# Patient Record
Sex: Male | Born: 1945 | Race: White | Hispanic: No | Marital: Married | Smoking: Current every day smoker
Health system: Southern US, Community
[De-identification: ages and names within clinical notes are randomized; demographics above are authoritative.]

## PROBLEM LIST (undated history)

## (undated) DIAGNOSIS — I10 Essential (primary) hypertension: Secondary | ICD-10-CM

## (undated) DIAGNOSIS — C61 Malignant neoplasm of prostate: Secondary | ICD-10-CM

## (undated) DIAGNOSIS — Z9289 Personal history of other medical treatment: Secondary | ICD-10-CM

## (undated) DIAGNOSIS — Z973 Presence of spectacles and contact lenses: Secondary | ICD-10-CM

## (undated) DIAGNOSIS — R51 Headache: Secondary | ICD-10-CM

## (undated) DIAGNOSIS — M8580 Other specified disorders of bone density and structure, unspecified site: Secondary | ICD-10-CM

## (undated) DIAGNOSIS — N529 Male erectile dysfunction, unspecified: Secondary | ICD-10-CM

## (undated) DIAGNOSIS — E785 Hyperlipidemia, unspecified: Secondary | ICD-10-CM

## (undated) DIAGNOSIS — R55 Syncope and collapse: Secondary | ICD-10-CM

## (undated) HISTORY — DX: Headache: R51

## (undated) HISTORY — DX: Other specified disorders of bone density and structure, unspecified site: M85.80

## (undated) HISTORY — DX: Syncope and collapse: R55

## (undated) HISTORY — PX: PROSTATE BIOPSY: SHX241

## (undated) HISTORY — DX: Hyperlipidemia, unspecified: E78.5

## (undated) HISTORY — PX: COLONOSCOPY: SHX174

## (undated) HISTORY — DX: Personal history of other medical treatment: Z92.89

## (undated) HISTORY — DX: Male erectile dysfunction, unspecified: N52.9

---

## 2000-10-31 ENCOUNTER — Ambulatory Visit (HOSPITAL_COMMUNITY): Admission: RE | Admit: 2000-10-31 | Discharge: 2000-10-31 | Payer: Self-pay | Admitting: General Surgery

## 2004-08-30 ENCOUNTER — Ambulatory Visit: Payer: Self-pay | Admitting: Internal Medicine

## 2004-10-03 ENCOUNTER — Ambulatory Visit: Payer: Self-pay | Admitting: Internal Medicine

## 2005-04-10 ENCOUNTER — Ambulatory Visit: Payer: Self-pay | Admitting: Internal Medicine

## 2005-04-18 ENCOUNTER — Ambulatory Visit: Payer: Self-pay | Admitting: Internal Medicine

## 2005-10-08 ENCOUNTER — Ambulatory Visit: Payer: Self-pay | Admitting: Internal Medicine

## 2005-10-17 ENCOUNTER — Encounter: Payer: Self-pay | Admitting: Internal Medicine

## 2005-10-18 ENCOUNTER — Ambulatory Visit: Payer: Self-pay | Admitting: Internal Medicine

## 2006-09-18 ENCOUNTER — Ambulatory Visit: Payer: Self-pay | Admitting: Internal Medicine

## 2006-09-18 LAB — CONVERTED CEMR LAB
BUN: 11 mg/dL (ref 6–23)
CO2: 31 meq/L (ref 19–32)
Calcium: 9.2 mg/dL (ref 8.4–10.5)
Chloride: 108 meq/L (ref 96–112)
Cholesterol: 218 mg/dL (ref 0–200)
Creatinine, Ser: 1 mg/dL (ref 0.4–1.5)
Direct LDL: 147.8 mg/dL
GFR calc Af Amer: 98 mL/min
GFR calc non Af Amer: 81 mL/min
Glucose, Bld: 82 mg/dL (ref 70–99)
HDL: 47.3 mg/dL (ref >39.0)
PSA: 1.93 ng/mL (ref 0.10–4.00)
Potassium: 4.3 meq/L (ref 3.5–5.1)
Sodium: 146 meq/L — ABNORMAL HIGH (ref 135–145)
Total CHOL/HDL Ratio: 4.6
Triglycerides: 118 mg/dL (ref 0–149)
VLDL: 24 mg/dL (ref 0–40)

## 2006-09-27 ENCOUNTER — Ambulatory Visit: Payer: Self-pay | Admitting: Internal Medicine

## 2007-04-07 ENCOUNTER — Ambulatory Visit: Payer: Self-pay | Admitting: Internal Medicine

## 2007-04-07 DIAGNOSIS — E785 Hyperlipidemia, unspecified: Secondary | ICD-10-CM | POA: Insufficient documentation

## 2007-04-09 LAB — CONVERTED CEMR LAB
ALT: 32 units/L (ref 0–53)
AST: 26 units/L (ref 0–37)
Cholesterol: 212 mg/dL (ref 0–200)
Direct LDL: 152.3 mg/dL
HDL: 38.5 mg/dL — ABNORMAL LOW (ref >39.0)
PSA: 1.18 ng/mL (ref 0.10–4.00)
TSH: 0.75 microintl units/mL (ref 0.35–5.50)
Testosterone: 669.3 ng/dL (ref 350.00–890)
Total CHOL/HDL Ratio: 5.5
Triglycerides: 91 mg/dL (ref 0–149)
VLDL: 18 mg/dL (ref 0–40)

## 2007-04-16 ENCOUNTER — Ambulatory Visit: Payer: Self-pay | Admitting: Internal Medicine

## 2007-04-16 DIAGNOSIS — F17200 Nicotine dependence, unspecified, uncomplicated: Secondary | ICD-10-CM | POA: Insufficient documentation

## 2007-04-16 DIAGNOSIS — F528 Other sexual dysfunction not due to a substance or known physiological condition: Secondary | ICD-10-CM | POA: Insufficient documentation

## 2007-04-22 ENCOUNTER — Encounter: Payer: Self-pay | Admitting: Internal Medicine

## 2007-06-06 ENCOUNTER — Ambulatory Visit: Payer: Self-pay | Admitting: Internal Medicine

## 2007-06-09 LAB — CONVERTED CEMR LAB
ALT: 30 units/L (ref 0–53)
AST: 27 units/L (ref 0–37)
Albumin: 3.8 g/dL (ref 3.5–5.2)
Alkaline Phosphatase: 53 units/L (ref 39–117)
Bilirubin, Direct: 0.2 mg/dL (ref 0.0–0.3)
Cholesterol: 134 mg/dL (ref 0–200)
HDL: 33.6 mg/dL — ABNORMAL LOW (ref >39.0)
LDL Cholesterol: 86 mg/dL (ref 0–99)
Total Bilirubin: 0.9 mg/dL (ref 0.3–1.2)
Total CHOL/HDL Ratio: 4
Total Protein: 6.5 g/dL (ref 6.0–8.3)
Triglycerides: 71 mg/dL (ref 0–149)
VLDL: 14 mg/dL (ref 0–40)

## 2007-07-04 ENCOUNTER — Ambulatory Visit: Payer: Self-pay | Admitting: Internal Medicine

## 2007-07-04 LAB — CONVERTED CEMR LAB
Cholesterol, target level: 200 mg/dL
HDL goal, serum: 40 mg/dL
LDL Goal: 130 mg/dL

## 2007-11-12 ENCOUNTER — Telehealth: Payer: Self-pay | Admitting: *Deleted

## 2007-11-12 ENCOUNTER — Ambulatory Visit: Payer: Self-pay | Admitting: Internal Medicine

## 2007-11-12 LAB — CONVERTED CEMR LAB: PSA: 1.61 ng/mL (ref 0.10–4.00)

## 2007-12-15 ENCOUNTER — Ambulatory Visit: Payer: Self-pay | Admitting: Internal Medicine

## 2007-12-15 DIAGNOSIS — M899 Disorder of bone, unspecified: Secondary | ICD-10-CM | POA: Insufficient documentation

## 2007-12-15 DIAGNOSIS — M949 Disorder of cartilage, unspecified: Secondary | ICD-10-CM

## 2008-06-18 ENCOUNTER — Ambulatory Visit: Payer: Self-pay | Admitting: Internal Medicine

## 2008-06-18 LAB — CONVERTED CEMR LAB
Cholesterol: 169 mg/dL (ref 0–200)
HDL: 44.6 mg/dL (ref >39.0)
LDL Cholesterol: 109 mg/dL — ABNORMAL HIGH (ref 0–99)
PSA: 1.72 ng/mL (ref 0.10–4.00)
Total CHOL/HDL Ratio: 3.8
Triglycerides: 78 mg/dL (ref 0–149)
VLDL: 16 mg/dL (ref 0–40)

## 2008-06-27 ENCOUNTER — Emergency Department (HOSPITAL_COMMUNITY): Admission: EM | Admit: 2008-06-27 | Discharge: 2008-06-27 | Payer: Self-pay | Admitting: Emergency Medicine

## 2008-06-28 ENCOUNTER — Ambulatory Visit: Payer: Self-pay | Admitting: Internal Medicine

## 2008-07-05 DIAGNOSIS — Z9289 Personal history of other medical treatment: Secondary | ICD-10-CM

## 2008-07-05 HISTORY — DX: Personal history of other medical treatment: Z92.89

## 2008-07-08 ENCOUNTER — Ambulatory Visit: Payer: Self-pay

## 2008-07-08 ENCOUNTER — Encounter: Payer: Self-pay | Admitting: Internal Medicine

## 2008-07-21 ENCOUNTER — Encounter: Payer: Self-pay | Admitting: Cardiology

## 2008-07-21 ENCOUNTER — Ambulatory Visit: Payer: Self-pay | Admitting: Cardiology

## 2008-10-21 ENCOUNTER — Telehealth: Payer: Self-pay | Admitting: *Deleted

## 2008-10-27 ENCOUNTER — Telehealth: Payer: Self-pay | Admitting: Internal Medicine

## 2008-12-13 ENCOUNTER — Ambulatory Visit: Payer: Self-pay | Admitting: Internal Medicine

## 2008-12-13 LAB — CONVERTED CEMR LAB
ALT: 27 units/L (ref 0–53)
AST: 28 units/L (ref 0–37)
Albumin: 3.5 g/dL (ref 3.5–5.2)
Alkaline Phosphatase: 55 units/L (ref 39–117)
Bilirubin, Direct: 0.1 mg/dL (ref 0.0–0.3)
Cholesterol: 137 mg/dL (ref 0–200)
HDL: 45.5 mg/dL (ref >39.00)
LDL Cholesterol: 80 mg/dL (ref 0–99)
PSA: 1.39 ng/mL (ref 0.10–4.00)
Total Bilirubin: 0.9 mg/dL (ref 0.3–1.2)
Total CHOL/HDL Ratio: 3
Total Protein: 6.4 g/dL (ref 6.0–8.3)
Triglycerides: 57 mg/dL (ref 0.0–149.0)
VLDL: 11.4 mg/dL (ref 0.0–40.0)

## 2008-12-23 ENCOUNTER — Ambulatory Visit: Payer: Self-pay | Admitting: Internal Medicine

## 2009-06-23 ENCOUNTER — Ambulatory Visit: Payer: Self-pay | Admitting: Internal Medicine

## 2009-07-12 LAB — CONVERTED CEMR LAB: PSA: 2.41 ng/mL (ref 0.10–4.00)

## 2009-10-04 ENCOUNTER — Ambulatory Visit: Payer: Self-pay | Admitting: Internal Medicine

## 2009-10-04 ENCOUNTER — Telehealth (INDEPENDENT_AMBULATORY_CARE_PROVIDER_SITE_OTHER): Payer: Self-pay | Admitting: *Deleted

## 2009-10-04 ENCOUNTER — Telehealth: Payer: Self-pay | Admitting: *Deleted

## 2009-10-07 ENCOUNTER — Encounter: Payer: Self-pay | Admitting: *Deleted

## 2009-10-07 LAB — CONVERTED CEMR LAB: PSA: 2.07 ng/mL (ref 0.10–4.00)

## 2010-04-18 ENCOUNTER — Ambulatory Visit: Payer: Self-pay | Admitting: Internal Medicine

## 2010-04-18 LAB — CONVERTED CEMR LAB
ALT: 23 units/L (ref 0–53)
AST: 24 units/L (ref 0–37)
Albumin: 3.8 g/dL (ref 3.5–5.2)
Alkaline Phosphatase: 61 units/L (ref 39–117)
BUN: 13 mg/dL (ref 6–23)
Basophils Absolute: 0.1 10*3/uL (ref 0.0–0.1)
Basophils Relative: 0.8 % (ref 0.0–3.0)
Bilirubin, Direct: 0.1 mg/dL (ref 0.0–0.3)
CO2: 29 meq/L (ref 19–32)
Calcium: 9.1 mg/dL (ref 8.4–10.5)
Chloride: 104 meq/L (ref 96–112)
Cholesterol: 168 mg/dL (ref 0–200)
Creatinine, Ser: 1 mg/dL (ref 0.4–1.5)
Eosinophils Absolute: 0.2 10*3/uL (ref 0.0–0.7)
Eosinophils Relative: 2.7 % (ref 0.0–5.0)
GFR calc non Af Amer: 79.82 mL/min (ref >60)
Glucose, Bld: 87 mg/dL (ref 70–99)
HCT: 44.5 % (ref 39.0–52.0)
HDL: 49.4 mg/dL (ref >39.00)
Hemoglobin: 15 g/dL (ref 13.0–17.0)
LDL Cholesterol: 106 mg/dL — ABNORMAL HIGH (ref 0–99)
Lymphocytes Relative: 32.4 % (ref 12.0–46.0)
Lymphs Abs: 2.6 10*3/uL (ref 0.7–4.0)
MCHC: 33.6 g/dL (ref 30.0–36.0)
MCV: 95.5 fL (ref 78.0–100.0)
Monocytes Absolute: 0.7 10*3/uL (ref 0.1–1.0)
Monocytes Relative: 8.4 % (ref 3.0–12.0)
Neutro Abs: 4.4 10*3/uL (ref 1.4–7.7)
Neutrophils Relative %: 55.7 % (ref 43.0–77.0)
PSA: 0.61 ng/mL (ref 0.10–4.00)
Platelets: 331 10*3/uL (ref 150.0–400.0)
Potassium: 4.3 meq/L (ref 3.5–5.1)
RBC: 4.66 M/uL (ref 4.22–5.81)
RDW: 13.9 % (ref 11.5–14.6)
Sodium: 139 meq/L (ref 135–145)
TSH: 0.73 microintl units/mL (ref 0.35–5.50)
Total Bilirubin: 0.7 mg/dL (ref 0.3–1.2)
Total CHOL/HDL Ratio: 3
Total Protein: 6.1 g/dL (ref 6.0–8.3)
Triglycerides: 62 mg/dL (ref 0.0–149.0)
VLDL: 12.4 mg/dL (ref 0.0–40.0)
WBC: 7.9 10*3/uL (ref 4.5–10.5)

## 2010-04-26 ENCOUNTER — Ambulatory Visit: Payer: Self-pay | Admitting: Internal Medicine

## 2010-07-04 NOTE — Assessment & Plan Note (Signed)
Summary: rov/mm reschedule/mhf      Allergies Added: NKDA  Vital Signs:  Patient Profile:   65 Years Old Male Weight:      144 pounds Temp:     98.3 degrees F oral BP sitting:   112 / 80  (left arm)  Vitals Entered By: Raechel Ache, RN (July 04, 2007 2:06 PM)                 Chief Complaint:  Talk about meds..  History of Present Illness: Brian Rodriguez   comes in today for follow up of  lipids. No side effect of meds.  see last ov. Recent job stresses.   Still smoking.   Try to stop and exercise.  Less junk food.    Following PSA  q 6 months , No symptom s now .      Lipid Management History:      Positive NCEP/ATP III risk factors include male age 70 years old or older, HDL cholesterol less than 40, and current tobacco user.  Negative NCEP/ATP III risk factors include non-diabetic, non-hypertensive, no ASHD (atherosclerotic heart disease), and no peripheral vascular disease.        He expresses no side effects from his lipid-lowering medication.  The patient denies any symptoms to suggest myopathy or liver disease from his "statin" therapy.     Current Allergies: No known allergies   Past Medical History:    see chart    Hyperlipidemia   Family History:    Reviewed history from 04/16/2007 and no changes required:       no premature heart disease        Family History of Prostate CA 1st degree relative <50  Social History:    Current Smoker    Married    Building control surveyor, [employer]    Regular exercise-no   Risk Factors:  Exercise:  no   Review of Systems  The patient denies anorexia, chest pain, dyspnea on exhertion, and prolonged cough.     Physical Exam  General:     alert, well-developed, well-nourished, and well-hydrated.   Additional Exam:     see labs and results  ldl    below 100   hdl low  ratio 4      Impression & Recommendations:  Problem # 1:  HYPERLIPIDEMIA (ICD-272.4) better  add lifestyle intervention  d/c tobacco His updated  medication list for this problem includes:    Mevacor 20 Mg Tabs (Lovastatin) .Marland Kitchen... 1 by mouth once daily  for cholesterol  Labs Reviewed: Chol: 134 (06/06/2007)   HDL: 33.6 (06/06/2007)   LDL: 86 (06/06/2007)   TG: 71 (06/06/2007) SGOT: 27 (06/06/2007)   SGPT: 30 (06/06/2007)  Lipid Goals: Chol Goal: 200 (07/04/2007)   HDL Goal: 40 (07/04/2007)   LDL Goal: 130 (07/04/2007)   TG Goal: 150 (07/04/2007)  10 Yr Risk Heart Disease: 11 %   Problem # 2:  TOBACCO USE (ICD-305.1) Encouraged smoking cessation and discussed different methods for smoking cessation.   Problem # 3:  NEOPLASM, MALIGNANT, PROSTATE, FAMILY HX (ICD-V16.42)  follow  and reapeat psa in may or april  Problem # 4:  ERECTILE DYSFUNCTION (ICD-302.72) Assessment: Comment Only d/c tobacco His updated medication list for this problem includes:    Viagra 50 Mg Tabs (Sildenafil citrate) .Marland Kitchen... 1 by mouth as needed   Complete Medication List: 1)  Viagra 50 Mg Tabs (Sildenafil citrate) .Marland Kitchen.. 1 by mouth as needed 2)  Multivitamins Caps (Multiple vitamin) 3)  Mevacor 20 Mg Tabs (Lovastatin) .Marland Kitchen.. 1 by mouth once daily  for cholesterol  Lipid Assessment/Plan:      Based on NCEP/ATP III, the patient's risk factor category is "2 or more risk factors and a calculated 10 year CAD risk of < 20%".  From this information, the patient's calculated lipid goals are as follows: Total cholesterol goal is 200; LDL cholesterol goal is 130; HDL cholesterol goal is 40; Triglyceride goal is 150.     Patient Instructions: 1)  PSA  in  May or June  Rov with me in July.    ]

## 2010-07-04 NOTE — Letter (Signed)
Summary: Generic Letter  [employer] [facility]  [address] [place], [place] [address]   Phone: [phone]  Fax: [phone]    10/07/2009  [facility] [address] [place], [place]  [address]  Dear Mr. MEISSNER,  Your PSA results  slightly lower than last time. We will recheck at this at your yearly visit  CPX  ( in 6 months with full set of labs. Please call our office to schedule this appt.          Sincerely,   Tor Netters, CMA (AAMA)

## 2010-07-04 NOTE — Assessment & Plan Note (Signed)
Summary: cpx/njr   Vital Signs:  Patient profile:   65 year old male Height:      64 inches Weight:      143 pounds BMI:     24.63 Pulse rate:   72 / minute BP sitting:   110 / 70  (left arm) Cuff size:   regular  Vitals Entered By: Romualdo Bolk, CMA (AAMA) (April 26, 2010 10:03 AM) CC: CPX   History of Present Illness: Brian Rodriguez comes in today  for preventive visit and med follow up  Since last visit  here  there have been no major changes in health status   Still smoking 1ppd ED uses viagra  1/2 pill sometimes  LIPIDS: no se of meds   Preventive Care Screening  Colonoscopy:    Date:  06/05/2003    Results:  normal   Prior Values:    PSA:  0.61 (04/18/2010)   Preventive Screening-Counseling & Management  Alcohol-Tobacco     Alcohol drinks/day: <1     Alcohol type: wine     Smoking Status: current     Smoking Cessation Counseling: yes     Smoke Cessation Stage: contemplative     Packs/Day: 1.0     Tobacco Counseling: to quit use of tobacco products  Caffeine-Diet-Exercise     Caffeine use/day: 1-2     Does Patient Exercise: no  Hep-HIV-STD-Contraception     Dental Visit-last 6 months no     Dental Care Counseling: to seek dental care; no dental care within six months     Sun Exposure-Excessive: no  Safety-Violence-Falls     Seat Belt Use: yes     Firearms in the Home: firearms in the home     Firearm Counseling: not indicated; uses recommended firearm safety measures     Smoke Detectors: yes     Fall Risk: none   Current Problems (verified): 1)  Osteopenia  (ICD-733.90) 2)  ? of Colonic Polyps, Hx of  (ICD-V12.72) 3)  Neoplasm, Malignant, Prostate, Family Hx  (ICD-V16.42) 4)  Tobacco Use  (ICD-305.1) 5)  Erectile Dysfunction  (ICD-302.72) 6)  Hyperlipidemia  (ICD-272.4)  Current Medications (verified): 1)  Viagra 50 Mg  Tabs (Sildenafil Citrate) .Marland Kitchen.. 1 By Mouth As Needed 2)  Multivitamins   Caps (Multiple Vitamin) 3)  Mevacor 40 Mg  Tabs (Lovastatin) .Marland Kitchen.. 1 By Mouth Once Daily  Allergies (verified): No Known Drug Allergies  Past History:  Past medical, surgical, family and social histories (including risk factors) reviewed, and no changes noted (except as noted below).  Past Medical History: 1. Hyperlipidemia 2. Headache 3. ED 4. Syncope 1/10 5. Echocardiogram (2/10):  EF 55-60%, no regional wall motion abnormalities, mildly calcified aortic valve.  ? osteopenia  Tobacco user tdap 2007 Colonic polyps, hx of?  Past Surgical History: Denies surgical history  Past History:  Care Management: Cardiology: Shirlee Latch  Family History: Reviewed history from 12/23/2008 and no changes required. No premature heart disease  Family History of Prostate CA 1st degree relative <50 father prostate cancer Brother prostate cancer    recently PSa went up      Social History: Reviewed history from 07/21/2008 and no changes required. Current Smoker: 1/2 ppd Married, 4 children  hhof  3  1 dog  Regular exercise-no Works in [employer]   works 2-3 shift  Sleep   6.5 hours sleep   Seat Belt Use:  yes Dental Care w/in 6 mos.:  no Sun Exposure-Excessive:  no Fall Risk:  none   Review of Systems       sometimes knees hurt  to squat.  no sig nocturia .    no cp sob and denies pulmonary signs .  rest of ros neg or no change see hpi  Physical Exam  General:  Well-developed,well-nourished,in no acute distress; alert,appropriate and cooperative throughout examination Head:  normocephalic, atraumatic, and no abnormalities observed.   Eyes:  vision grossly intact, pupils equal, and pupils round.  glasses  Ears:  R ear normal, L ear normal, and no external deformities.   Nose:  no external deformity, no external erythema, and no nasal discharge.   Mouth:  good dentition and pharynx pink and moist.   Neck:  No deformities, masses, or tenderness noted. Lungs:  Normal respiratory effort, chest expands symmetrically. Lungs are  clear to auscultation, no crackles or wheezes.no dullness.   Heart:  Normal rate and regular rhythm. S1 and S2 normal without gallop, murmur, click, rub or other extra sounds.no lifts.   Abdomen:  Bowel sounds positive,abdomen soft and non-tender without masses, organomegaly or hernias noted. Rectal:  declined Prostate:  declined  Msk:  no joint tenderness, no joint swelling, no joint warmth, no redness over joints, and no joint deformities.   Pulses:  pulses intact without delay  no bruits  Extremities:  No clubbing, cyanosis, edema, or deformity noted with normal full range of motion of all joints.   Neurologic:  alert & oriented X3, cranial nerves II-XII intact, strength normal in all extremities, and gait normal.   no abnormal reflexes Skin:  turgor normal, color normal, no ecchymoses, and no petechiae.  sunchanges  Cervical Nodes:  No lymphadenopathy noted Axillary Nodes:  No palpable lymphadenopathy Inguinal Nodes:  No significant adenopathy Psych:  Oriented X3, normally interactive, good eye contact, not anxious appearing, and not depressed appearing.     Impression & Recommendations:  Problem # 1:  PREVENTIVE HEALTH CARE (ICD-V70.0) Discussed nutrition,exercise,diet,healthy weight, vitamin D and calcium.     Reviewed preventive care protocols, scheduled due services, and updated immunizations.  Problem # 2:  TOBACCO USE (ICD-305.1) not ready to stop.  still counseled  Encouraged smoking cessation and discussed different methods for smoking cessation.   Orders: Tobacco use cessation intermediate 3-10 minutes ([address]109)  Problem # 3:  ERECTILE DYSFUNCTION (ICD-302.72) no change  dc tobacco  His updated medication list for this problem includes:    Viagra 50 Mg Tabs (Sildenafil citrate) .Marland Kitchen... 1 by mouth as needed  Problem # 4:  HYPERLIPIDEMIA (ICD-272.4) no change  His updated medication list for this problem includes:    Mevacor 40 Mg Tabs (Lovastatin) .Marland Kitchen... 1 by mouth once  daily  Labs Reviewed: SGOT: 24 (04/18/2010)   SGPT: 23 (04/18/2010)  Lipid Goals: Chol Goal: 200 (07/04/2007)   HDL Goal: 40 (07/04/2007)   LDL Goal: 130 (07/04/2007)   TG Goal: 150 (07/04/2007)  Prior 10 Yr Risk Heart Disease: 11 % (07/04/2007)   HDL:49.40 (04/18/2010), 45.50 (12/13/2008)  LDL:106 (04/18/2010), 80 (60/45/4098)  Chol:168 (04/18/2010), 137 (12/13/2008)  Trig:62.0 (04/18/2010), 57.0 (12/13/2008)  Complete Medication List: 1)  Viagra 50 Mg Tabs (Sildenafil citrate) .Marland Kitchen.. 1 by mouth as needed 2)  Multivitamins Caps (Multiple vitamin) 3)  Mevacor 40 Mg Tabs (Lovastatin) .Marland Kitchen.. 1 by mouth once daily  Other Orders: Tdap => 37yrs IM (11914) Admin 1st Vaccine (78295)   Patient Instructions: 1)  stop smoking   2)  continue medication. 3)  cpx with labs in a year  4)  have pharmacy contact us  when refills needed .  Prescriptions: MEVACOR 40 MG TABS (LOVASTATIN) 1 by mouth once daily  #30 Tablet x 12   Entered and Authorized by:   Madelin Headings MD   Signed by:   Madelin Headings MD on 04/26/2010   Method used:   Elect[facility]ically to        [id number]       [address]       [place], [place]  [address]       Ph: [phone] or [phone]       Fax: [phone]   RxID:   [id number]    Orders Added: 1)  Tdap => 46yrs IM [90715] 2)  Admin 1st Vaccine [90471] 3)  Est. Patient 40-64 years [99396] 4)  Tobacco use cessation intermediate 3-10 minutes [99406]   Immunizations Administered:  Tetanus Vaccine:    Vaccine Type: Tdap    Site: right deltoid    Mfr: GlaxoSmithKline    Dose: 0.5 ml    Route: IM    Given by: Romualdo Bolk, CMA (AAMA)    Exp. Date: 03/23/2012    Lot #: [id number]   Immunizations Administered:  Tetanus Vaccine:    Vaccine Type: Tdap    Site: right deltoid    Mfr: GlaxoSmithKline    Dose: 0.5 ml    Route: IM    Given by: Romualdo Bolk, CMA (AAMA)    Exp. Date: 03/23/2012    Lot #:  [id number]   Preventive Care Screening  Colonoscopy:    Date:  06/05/2003    Results:  normal   Prior Values:    PSA:  0.61 (04/18/2010)

## 2010-07-04 NOTE — Assessment & Plan Note (Signed)
Summary: FDOLLOW UP FROM BLOOD WORK/MHF   Vital Signs:  Patient Profile:   65 Years Old Male Weight:      142 pounds Pulse rate:   78 / minute BP sitting:   110 / 70  (right arm) Cuff size:   regular  Vitals Entered By: Romualdo Bolk, CMA (June 28, 2008 2:55 PM)                 Chief Complaint:  Follow up.  History of Present Illness: Brian Rodriguez is here for a follow up on psa and cholesterol.   Interim hx : Was seen in ER  [facility]  1/24  for syncope.  .  ? if wash food poisoning  ?  Had fish sandwich and ff   and lots of burping .   no sig alcohol  ,  dehydration.  ?     no water all day  but coffee all day.   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  went by EMS to hospital  .     had neg ekg  troponins and  cxray and cbc and bmet.     went home ama after  no sob or palpitations .He apparently had pusle of 40  in ambulance with signs but then ekg neg and eval was neg   .    Feels fine today.  No recent viagra/ . No ha visin change numbness or weakness.  No vomiting or diarrhea . No hx of fainting ever in his life. He feels he is fine but comes in for follow up today.  as instructed.  LIPIds: no se of meds       needs refill. TObacco : has cut down to 1/2 ppd .  trying to eat right.     Prior Medications Reviewed Using: Patient Recall  Prior Medication List:  VIAGRA 50 MG  TABS (SILDENAFIL CITRATE) 1 by mouth as needed MULTIVITAMINS   CAPS (MULTIPLE VITAMIN)  MEVACOR 20 MG  TABS (LOVASTATIN) 1 by mouth once daily  for cholesterol   Current Allergies (reviewed today): No known allergies   Past Medical History:    see chart    Hyperlipidemia    Headache    ED    tdap 2007    Colonic polyps, hx of?        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     Physical Exam  General:     Well-developed,well-nourished,in no acute distress; alert,appropriate and cooperative throughout examination Head:     normocephalic and atraumatic.   Eyes:     vision grossly intact, pupils equal, and pupils round.   Ears:     no external deformities.   Nose:     no external deformity.   Neck:  No deformities, masses, or tenderness noted. Lungs:     Normal respiratory effort, chest expands symmetrically. Lungs are clear to auscultation, no crackles or wheezes. Heart:     Normal rate and regular rhythm. S1 and S2 normal without gallop, murmur, click, rub or other extra sounds.no gallop.   Abdomen:     Bowel sounds positive,abdomen soft and non-tender without masses, organomegaly or  noted. Pulses:     intact without delay Extremities:     no cce  Neurologic:     non focal gait normal and DTRs symmetrical and normal.   Skin:     turgor normal, color normal, no ecchymoses, and no petechiae.   Cervical Nodes:     No lymphadenopathy noted Psych:     Oriented X3, memory intact for recent and remote, normally interactive, good eye contact, not anxious appearing, and not depressed appearing.   Additional Exam:     EKG NSR no acute changes  rate  73 and normal intervals  Records in E chart  obtained and reviewed  and labs .    Impression & Recommendations:  Problem # 1:  SYNCOPE (ICD-780.2) Assessment: New sounds vagal but recurrent  and with pulse of 40 still could have been vagal.   during that night.    has risk factors   .... will  do echo  and cards consult about any need for further eval.  Orders: Cardiology Referral (Cardiology) Echo Referral (Echo) EKG w/ Interpretation (93000)   Problem # 2:  HYPERLIPIDEMIA (ICD-272.4) Assessment: Improved no se of meds  His updated medication list for this problem includes:    Mevacor 20 Mg Tabs (Lovastatin) .Marland Kitchen... 1 by mouth once daily  for cholesterol  Labs Reviewed: Chol: 169 (06/18/2008)   HDL: 44.6 (06/18/2008)   LDL: 109 (06/18/2008)   TG: 78 (06/18/2008) SGOT: 27 (06/06/2007)   SGPT: 30 (06/06/2007)  Lipid Goals: Chol Goal: 200 (07/04/2007)   HDL Goal: 40 (07/04/2007)   LDL Goal: 130 (07/04/2007)   TG Goal: 150 (07/04/2007)  Prior 10 Yr Risk Heart Disease: 11 % (07/04/2007)   Problem # 3:  TOBACCO USE (ICD-305.1) rec dc  Problem # 4:  NEOPLASM, MALIGNANT, PROSTATE, FAMILY HX (ICD-V16.42) Assessment: Comment Only follow   Problem # 5:  ERECTILE DYSFUNCTION (ICD-302.72)  His updated medication list for this problem includes:    Viagra 50 Mg Tabs (Sildenafil citrate) .Marland Kitchen... 1 by mouth as needed   Complete Medication List: 1)  Viagra 50 Mg Tabs (Sildenafil citrate) .Marland Kitchen.. 1 by mouth as needed 2)  Multivitamins Caps (Multiple vitamin) 3)  Mevacor 20 Mg Tabs (Lovastatin) .Marland Kitchen.. 1 by mouth once daily  for cholesterol   Patient Instructions: 1)  continue on statin medication.  2)   continue tobacco cessation. 3)  will call you about referral to  echo test and cardiology for evaluation of syncope. 4)  rec return office visit after evaluation from   cardiology.   Prescriptions: MEVACOR 20 MG  TABS (LOVASTATIN) 1 by mouth once daily  for cholesterol  #30 Tablet x 6   Entered and Authorized by:   Madelin Headings MD   Signed by:   Madelin Headings MD on 06/28/2008   Method used:   [facility]        [facility] [phone][facility] (retail)       [address]       [place], [place]  [address]       Ph: [phone] or [phone]  Fax: [phone]   RxID:    [id number]

## 2010-07-04 NOTE — Progress Notes (Signed)
Summary: REQUEST FOR REFILLS  Phone Note Refill Request Message from:  Patient on Oct 04, 2009 11:31 AM  Refills Requested: Medication #1:  VIAGRA 50 MG  TABS 1 by mouth as needed  Medication #2:  MEVACOR 40 MG TABS 1 by mouth once daily.  Method Requested: Electronic Next Appointment Scheduled: NONE Initial call taken by: Roney Jaffe,  Oct 04, 2009 11:31 AM  Follow-up for Phone Call        please refill x 6  Follow-up by: Madelin Headings MD,  Oct 04, 2009 12:25 PM  Additional Follow-up for Phone Call Additional follow up Details #1::        Rx sent to pharmacy Additional Follow-up by: Romualdo Bolk, CMA Duncan Dull),  Oct 04, 2009 1:40 PM    Prescriptions: MEVACOR 40 MG TABS (LOVASTATIN) 1 by mouth once daily  #30 Tablet x 5   Entered by:   Romualdo Bolk, CMA (AAMA)   Authorized by:   Madelin Headings MD   Signed by:   Romualdo Bolk, CMA (AAMA) on 10/04/2009   Method used:   Electronically to        [facility] [phone][facility] (retail)       [address]       [place], [place]  [address]       Ph: [phone] or [phone]       Fax: [phone]   RxID:   [id number] VIAGRA 50 MG  TABS (SILDENAFIL CITRATE) 1 by mouth as needed  #6 x 5   Entered by:   Romualdo Bolk, CMA (AAMA)   Authorized by:   Madelin Headings MD   Signed by:   Romualdo Bolk, CMA (AAMA) on 10/04/2009   Method used:   Electronically to        [facility] [phone]* (retail)       [address]       [place], [place]  [address]       Ph: [phone] or [phone]       Fax: [phone]   RxID:   [id number]

## 2010-07-04 NOTE — Progress Notes (Signed)
Summary: TO Brian Rodriguez  Phone Note Call from Patient Call back at [facility] Phone [phone]   Caller: Patient- LIVE CALL Summary of Call: RETURNING Fernanda Twaddell'S CALL ABOUT A RX. Initial call taken by: Warnell Forester,  Oct 21, 2008 9:07 AM  Follow-up for Phone Call        Left message on machine to call back. Follow-up by: Romualdo Bolk, CMA,  Oct 21, 2008 12:10 PM  Additional Follow-up for Phone Call Additional follow up Details #1::        Pt aware of results. Additional Follow-up by: Romualdo Bolk, CMA,  Oct 25, 2008 9:36 AM      Appended Document: TO [name] Medications Added MEVACOR 40 MG TABS (LOVASTATIN) 1 by mouth once daily       Pt needs [place] to go ahead and call in the lovastatin 40mg . Rx called in. Romualdo Bolk, CMA  Oct 25, 2008 9:39 AM   Clinical Lists Changes  Medications: Changed medication from MEVACOR 20 MG  TABS (LOVASTATIN) 1 by mouth once daily  for cholesterol to MEVACOR 40 MG TABS (LOVASTATIN) 1 by mouth once daily - Signed Rx of MEVACOR 40 MG TABS (LOVASTATIN) 1 by mouth once daily;  #30 x 5;  Signed;  Entered by: Romualdo Bolk, CMA;  Authorized by: Madelin Headings MD;  Method used: [id number] [address], [place], [place]  [address], Ph: [phone] or [phone], Fax: [phone]    Prescriptions: MEVACOR 40 MG TABS (LOVASTATIN) 1 by mouth once daily  #30 x 5   Entered by:   Romualdo Bolk, CMA   Authorized by:   Madelin Headings MD   Signed by:   Romualdo Bolk, CMA on 10/25/2008   Method used:   Electronically to        [facility] [phone]* (retail)       [address]       [place], [place]  [address]       Ph: [phone] or [phone]       Fax: [phone]   RxID:   [id number]

## 2010-09-18 LAB — POCT I-STAT, CHEM 8
BUN: 17 mg/dL (ref 6–23)
Calcium, Ion: 1.07 mmol/L — ABNORMAL LOW (ref 1.12–1.32)
Chloride: 106 mEq/L (ref 96–112)
Creatinine, Ser: 1.2 mg/dL (ref 0.4–1.5)
Glucose, Bld: 126 mg/dL — ABNORMAL HIGH (ref 70–99)
HCT: 44 % (ref 39.0–52.0)
Hemoglobin: 15 g/dL (ref 13.0–17.0)
Potassium: 3.6 mEq/L (ref 3.5–5.1)
Sodium: 140 mEq/L (ref 135–145)
TCO2: 23 mmol/L (ref 0–100)

## 2010-09-18 LAB — DIFFERENTIAL
Basophils Absolute: 0 10*3/uL (ref 0.0–0.1)
Basophils Relative: 0 % (ref 0–1)
Eosinophils Absolute: 0.2 10*3/uL (ref 0.0–0.7)
Eosinophils Relative: 2 % (ref 0–5)
Lymphocytes Relative: 28 % (ref 12–46)
Lymphs Abs: 2.8 10*3/uL (ref 0.7–4.0)
Monocytes Absolute: 0.7 10*3/uL (ref 0.1–1.0)
Monocytes Relative: 7 % (ref 3–12)
Neutro Abs: 6.3 10*3/uL (ref 1.7–7.7)
Neutrophils Relative %: 63 % (ref 43–77)

## 2010-09-18 LAB — POCT CARDIAC MARKERS
CKMB, poc: <1 ng/mL — ABNORMAL LOW (ref 1.0–8.0)
Myoglobin, poc: 53.5 ng/mL (ref 12–200)
Troponin i, poc: <0.05 ng/mL (ref 0.00–0.09)

## 2010-09-18 LAB — CBC
HCT: 42.7 % (ref 39.0–52.0)
Hemoglobin: 14 g/dL (ref 13.0–17.0)
MCHC: 32.7 g/dL (ref 30.0–36.0)
MCV: 93 fL (ref 78.0–100.0)
Platelets: 338 10*3/uL (ref 150–400)
RBC: 4.59 MIL/uL (ref 4.22–5.81)
RDW: 13.9 % (ref 11.5–15.5)
WBC: 10.1 10*3/uL (ref 4.0–10.5)

## 2010-10-17 NOTE — Assessment & Plan Note (Signed)
[facility]                                 ON-CALL NOTE   Brian Rodriguez, Brian Rodriguez                          MRN:          [id number]  DATE:06/27/2008                            DOB:          07-14-1945    TIME RECEIVED:  2:12 a.m.   He sees Dr. Fabian Sharp.   CALLER:  Dr. Renella Cunas in [facility] Emergency Room.   I was connected to him can via the [employer], the answering  service.  The patient was seen in the emergency room after 2 syncopal  spells at home, each time he had dizziness and flushing and passed out  briefly.  While he was en route to the emergency room with EMS, his  pulse slowed down into the 40s.  After a brief workup in the emergency  room, nothing specific was found to explain this.  It was recommended  that he be kept overnight for observation, but the patient refused.  He  is now leaving against medical advice.  Apparently, he is scheduled to  see Dr. Fabian Sharp on Monday.  Dr. Katrinka Blazing simply wanted me to relay this  information.     Tera Mater. Clent Ridges, MD  Electronically Signed    SAF/MedQ  DD: 06/27/2008  DT: 06/27/2008  Job #: [id number]

## 2010-10-26 ENCOUNTER — Telehealth: Payer: Self-pay | Admitting: Internal Medicine

## 2010-10-26 NOTE — Telephone Encounter (Signed)
Pt called and is req to get psa and cholesterol lvls checked. Pls advise.

## 2010-10-31 NOTE — Telephone Encounter (Signed)
Lft vm for pt to cb to sch requested labs, as noted. Waiting on call back.

## 2010-10-31 NOTE — Telephone Encounter (Signed)
Per Dr. Fabian Sharp- okay to scheduled lipids and psa dx 272.4 and screening for prostate cancer.

## 2010-11-07 NOTE — Telephone Encounter (Signed)
Lft another vm for pt to cb to sch the labs requested, as noted.

## 2010-11-23 NOTE — Telephone Encounter (Signed)
Pt called back and lft vm. Tried calling pt back, but got vm again. Lft vm for pt and told pt that info re: sch labs is in computer in pts chart, so anyone can sch when pt calls back again.

## 2010-12-01 ENCOUNTER — Other Ambulatory Visit (INDEPENDENT_AMBULATORY_CARE_PROVIDER_SITE_OTHER): Payer: BC Managed Care – PPO

## 2010-12-01 DIAGNOSIS — E785 Hyperlipidemia, unspecified: Secondary | ICD-10-CM

## 2010-12-01 DIAGNOSIS — Z125 Encounter for screening for malignant neoplasm of prostate: Secondary | ICD-10-CM

## 2010-12-01 LAB — LIPID PANEL
Cholesterol: 141 mg/dL (ref 0–200)
HDL: 45.9 mg/dL (ref >39.00)
LDL Cholesterol: 76 mg/dL (ref 0–99)
Total CHOL/HDL Ratio: 3
Triglycerides: 98 mg/dL (ref 0.0–149.0)
VLDL: 19.6 mg/dL (ref 0.0–40.0)

## 2010-12-01 LAB — PSA: PSA: 1.63 ng/mL (ref 0.10–4.00)

## 2010-12-05 ENCOUNTER — Encounter: Payer: Self-pay | Admitting: *Deleted

## 2010-12-15 ENCOUNTER — Other Ambulatory Visit: Payer: Self-pay | Admitting: Internal Medicine

## 2010-12-15 NOTE — Telephone Encounter (Signed)
LCPX 04/26/10 NOV NONE PLEASE ADVISE

## 2011-05-23 ENCOUNTER — Other Ambulatory Visit: Payer: Self-pay | Admitting: Internal Medicine

## 2011-05-23 DIAGNOSIS — E785 Hyperlipidemia, unspecified: Secondary | ICD-10-CM

## 2011-05-23 DIAGNOSIS — Z125 Encounter for screening for malignant neoplasm of prostate: Secondary | ICD-10-CM

## 2011-05-23 NOTE — Telephone Encounter (Signed)
Pt said he needed to come in for his 6 month follow up and said he needed lab work first. There is no order in system what labs does pt need?

## 2011-06-15 ENCOUNTER — Other Ambulatory Visit (INDEPENDENT_AMBULATORY_CARE_PROVIDER_SITE_OTHER): Payer: BC Managed Care – PPO

## 2011-06-15 DIAGNOSIS — Z125 Encounter for screening for malignant neoplasm of prostate: Secondary | ICD-10-CM

## 2011-06-15 DIAGNOSIS — E785 Hyperlipidemia, unspecified: Secondary | ICD-10-CM

## 2011-06-15 LAB — LIPID PANEL
Cholesterol: 159 mg/dL (ref 0–200)
HDL: 44.5 mg/dL (ref >39.00)
LDL Cholesterol: 92 mg/dL (ref 0–99)
Total CHOL/HDL Ratio: 4
Triglycerides: 115 mg/dL (ref 0.0–149.0)
VLDL: 23 mg/dL (ref 0.0–40.0)

## 2011-06-15 LAB — PSA: PSA: 1.59 ng/mL (ref 0.10–4.00)

## 2011-06-26 ENCOUNTER — Encounter: Payer: Self-pay | Admitting: *Deleted

## 2011-06-26 NOTE — Progress Notes (Signed)
Quick Note:    Letter sent to pt.  ______

## 2011-07-10 ENCOUNTER — Telehealth: Payer: Self-pay | Admitting: *Deleted

## 2011-07-10 NOTE — Telephone Encounter (Signed)
Refills on lovastatin 40mg  and viagra 50mg  for a 90 days supply

## 2011-07-12 MED ORDER — LOVASTATIN 40 MG PO TABS: 40.0000 mg | ORAL_TABLET | Freq: Every day | ORAL | Status: DC

## 2011-07-12 MED ORDER — SILDENAFIL CITRATE 50 MG PO TABS: 50.0000 mg | ORAL_TABLET | ORAL | Status: DC | PRN

## 2011-07-12 NOTE — Telephone Encounter (Signed)
Labs noted  Ok to do 90 days supply of each  No refill. Still needs  Yearly office visit  Labs stable

## 2012-03-26 ENCOUNTER — Other Ambulatory Visit (INDEPENDENT_AMBULATORY_CARE_PROVIDER_SITE_OTHER): Payer: BC Managed Care – PPO

## 2012-03-26 DIAGNOSIS — Z0000 Encounter for general adult medical examination without abnormal findings: Secondary | ICD-10-CM

## 2012-03-26 LAB — POCT URINALYSIS DIPSTICK
Bilirubin, UA: NEGATIVE
Glucose, UA: NEGATIVE
Ketones, UA: NEGATIVE
Leukocytes, UA: NEGATIVE
Nitrite, UA: NEGATIVE
Protein, UA: NEGATIVE
Spec Grav, UA: 1.02
Urobilinogen, UA: 0.2
pH, UA: 7

## 2012-03-26 LAB — CBC WITH DIFFERENTIAL/PLATELET
Basophils Absolute: 0.1 10*3/uL (ref 0.0–0.1)
Basophils Relative: 0.7 % (ref 0.0–3.0)
Eosinophils Absolute: 0.2 10*3/uL (ref 0.0–0.7)
Eosinophils Relative: 2.4 % (ref 0.0–5.0)
HCT: 46.1 % (ref 39.0–52.0)
Hemoglobin: 15 g/dL (ref 13.0–17.0)
Lymphocytes Relative: 25.5 % (ref 12.0–46.0)
Lymphs Abs: 2.2 10*3/uL (ref 0.7–4.0)
MCHC: 32.6 g/dL (ref 30.0–36.0)
MCV: 95.8 fl (ref 78.0–100.0)
Monocytes Absolute: 0.7 10*3/uL (ref 0.1–1.0)
Monocytes Relative: 8.5 % (ref 3.0–12.0)
Neutro Abs: 5.4 10*3/uL (ref 1.4–7.7)
Neutrophils Relative %: 62.9 % (ref 43.0–77.0)
Platelets: 338 10*3/uL (ref 150.0–400.0)
RBC: 4.81 Mil/uL (ref 4.22–5.81)
RDW: 14.2 % (ref 11.5–14.6)
WBC: 8.6 10*3/uL (ref 4.5–10.5)

## 2012-03-26 LAB — HEPATIC FUNCTION PANEL
ALT: 27 U/L (ref 0–53)
AST: 26 U/L (ref 0–37)
Albumin: 3.5 g/dL (ref 3.5–5.2)
Alkaline Phosphatase: 54 U/L (ref 39–117)
Bilirubin, Direct: 0.2 mg/dL (ref 0.0–0.3)
Total Bilirubin: 1 mg/dL (ref 0.3–1.2)
Total Protein: 6.6 g/dL (ref 6.0–8.3)

## 2012-03-26 LAB — BASIC METABOLIC PANEL
BUN: 13 mg/dL (ref 6–23)
CO2: 30 mEq/L (ref 19–32)
Calcium: 9.2 mg/dL (ref 8.4–10.5)
Chloride: 107 mEq/L (ref 96–112)
Creatinine, Ser: 0.9 mg/dL (ref 0.4–1.5)
GFR: 85.22 mL/min (ref >60.00)
Glucose, Bld: 77 mg/dL (ref 70–99)
Potassium: 5.3 mEq/L — ABNORMAL HIGH (ref 3.5–5.1)
Sodium: 143 mEq/L (ref 135–145)

## 2012-03-26 LAB — LIPID PANEL
Cholesterol: 157 mg/dL (ref 0–200)
HDL: 47.5 mg/dL (ref >39.00)
LDL Cholesterol: 93 mg/dL (ref 0–99)
Total CHOL/HDL Ratio: 3
Triglycerides: 83 mg/dL (ref 0.0–149.0)
VLDL: 16.6 mg/dL (ref 0.0–40.0)

## 2012-03-28 LAB — TSH: TSH: 1.22 u[IU]/mL (ref 0.35–5.50)

## 2012-03-28 LAB — PSA: PSA: 1.89 ng/mL (ref 0.10–4.00)

## 2012-04-09 ENCOUNTER — Encounter: Payer: Self-pay | Admitting: Internal Medicine

## 2012-04-09 ENCOUNTER — Ambulatory Visit (INDEPENDENT_AMBULATORY_CARE_PROVIDER_SITE_OTHER): Payer: BC Managed Care – PPO | Admitting: Internal Medicine

## 2012-04-09 VITALS — BP 146/90 | HR 75 | Temp 98.4°F | Ht 63.75 in | Wt 144.0 lb

## 2012-04-09 DIAGNOSIS — F17200 Nicotine dependence, unspecified, uncomplicated: Secondary | ICD-10-CM

## 2012-04-09 DIAGNOSIS — F528 Other sexual dysfunction not due to a substance or known physiological condition: Secondary | ICD-10-CM

## 2012-04-09 DIAGNOSIS — R030 Elevated blood-pressure reading, without diagnosis of hypertension: Secondary | ICD-10-CM

## 2012-04-09 DIAGNOSIS — Z8042 Family history of malignant neoplasm of prostate: Secondary | ICD-10-CM

## 2012-04-09 DIAGNOSIS — D485 Neoplasm of uncertain behavior of skin: Secondary | ICD-10-CM | POA: Insufficient documentation

## 2012-04-09 DIAGNOSIS — E785 Hyperlipidemia, unspecified: Secondary | ICD-10-CM

## 2012-04-09 DIAGNOSIS — Z1211 Encounter for screening for malignant neoplasm of colon: Secondary | ICD-10-CM

## 2012-04-09 DIAGNOSIS — Z0000 Encounter for general adult medical examination without abnormal findings: Secondary | ICD-10-CM

## 2012-04-09 MED ORDER — SILDENAFIL CITRATE 50 MG PO TABS: 50.0000 mg | ORAL_TABLET | ORAL | Status: DC | PRN

## 2012-04-09 NOTE — Progress Notes (Signed)
Chief Complaint  Patient presents with  . Annual Exam  . Hyperlipidemia    HPI: Patient comes in today for Preventive Health Care visit  Last visit here was  04/2010 Since that time he has had no major changes in his health status. He continues to work 36-48 hours a week. His mother did pass away from Parkinson's in her 51s. He continues to use tobacco about one pack per day states it is not a good time to stop. No chest pain shortness of breath new urinary symptoms. He gets wax buildup in his right ear. Household of 3 PET dog social alcohol occasional some caffeine. Uses viagra 1/2 dose gets heartburn described as chest bubble better with burping and no sob or cardiac sx.   ROS:  GEN/ HEENT: No fever, significant weight changes sweats headaches vision problems hearing changes, CV/ PULM; No chest pain shortness of breath cough, syncope,edema  change in exercise tolerance. GI /GU: No adominal pain, vomiting, change in bowel habits. No blood in the stool. No significant GU symptoms. SKIN/HEME: ,no acute skin rashes urea of right neck has been there a couple years uncertain if changing or bleeding. No lymphadenopathy, nodules, masses.  NEURO/ PSYCH:  No neurologic signs such as weakness numbness. No depression anxiety. IMM/ Allergy: No unusual infections.  Allergy .   REST of 12 system review negative except as per HPI   Past Medical History  Diagnosis Date  . Hyperlipidemia   . Headache   . ED (erectile dysfunction)   . Syncope   . Osteopenia   . H/O echocardiogram 2/10    ef 50 -60 mildly calcific aortic valve    Family History  Problem Relation Age of Onset  . Cancer Father     Prostate  . Cancer Brother     Prostate  . Parkinsonism Mother     deceased 82s     History   Social History  . Marital Status: Married    Spouse Name: April Hinchliffe    Number of Children: 4  . Years of Education: N/A   Social History Main Topics  . Smoking status: Current Every Day Smoker    . Smokeless tobacco: None  . Alcohol Use: Yes  . Drug Use: None  . Sexually Active: None   Other Topics Concern  . 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 lovastatin (MEVACOR) 40 MG tablet Take 1 tablet (40 mg total) by mouth at bedtime.  90 tablet  3  . sildenafil (VIAGRA) 50 MG tablet Take 1 tablet (50 mg total) by mouth as needed for erectile dysfunction.  18 tablet  3  . [DISCONTINUED] sildenafil (VIAGRA) 50 MG tablet Take 1 tablet (50 mg total) by mouth as needed for erectile dysfunction.  12 tablet  3    EXAM: BP 146/90  Pulse 75  Temp 98.4 F (36.9 C) (Oral)  Ht 5' 3.75" (1.619 m)  Wt 144 lb (65.318 kg)  BMI 24.91 kg/m2  SpO2 97%  Physical Exam: Vital signs reviewed [id number] is a well-developed well-nourished alert cooperative   male who appears her stated age in no acute distress.  HEENT: normocephalic atraumatic , Eyes: PERRL EOM's full, conjunctiva clear, Nares: paten,t no deformity discharge or tenderness., Ears: no deformity EAC's wax in right TMs with normal landmarks. Mouth: clear OP,  no lesions, edema.  Moist mucous membranes. Dentition in adequate repair. NECK: supple without masses, thyromegaly or bruits. CHEST/PULM:  Clear to auscultation and percussion breath sounds equal no wheeze , rales or rhonchi. No chest wall deformities or tenderness. CV: PMI is nondisplaced, S1 S2 no gallops, murmurs, rubs. Peripheral pulses are without delay.No JVD . No bruits  ABDOMEN: Bowel sounds normal nontender  No guard or rebound, no hepato splenomegal no CVA tenderness.  No hernia. Extremtities:  No clubbing cyanosis or edema, no acute joint swelling or redness no focal atrophy NEURO:  Oriented x3, cranial nerves 3-12 appear to be intact, no obvious focal weakness,gait within normal limits no abnormal reflexes or  asymmetrical SKIN: No acute rashes normal turgor, color, no bruising or petechiae. Solar changes: right neck dark keratotic lesion with pigment base about 1 cm  also dark round mole 3 mm left back   Toe nails dystrophic thickened   PSYCH: Oriented, good eye contact, no obvious depression anxiety, cognition and judgment appear normal. LN: no cervical axillary inguinal adenopathy RECtal no masses : prostate smooth 1-2 +  Lab Results  Component Value Date   WBC 8.6 03/26/2012   HGB 15.0 03/26/2012   HCT 46.1 03/26/2012   PLT 338.0 03/26/2012   GLUCOSE 77 03/26/2012   CHOL 157 03/26/2012   TRIG 83.0 03/26/2012   HDL 47.50 03/26/2012   LDLDIRECT 152.3 04/07/2007   LDLCALC 93 03/26/2012   ALT 27 03/26/2012   AST 26 03/26/2012   NA 143 03/26/2012   K 5.3* 03/26/2012   CL 107 03/26/2012   CREATININE 0.9 03/26/2012   BUN 13 03/26/2012   CO2 30 03/26/2012   TSH 1.22 03/26/2012   PSA 1.89 03/26/2012    ASSESSMENT AND PLAN: Discussed the following assessment and plan: Preventive Health Care Counseled regarding healthy nutrition, exercise, sleep, injury prevention, calcium vit d and healthy weight . Flu vaccine obtained at work Disc  Shingles vaccine check on insurance coverage  1. Visit for preventive health examination     refer for colon  2. Family hx of prostate cancer    3. Elevated blood pressure reading    4. HYPERLIPIDEMIA    5. ERECTILE DYSFUNCTION     ok to use 1/2 dose  6. TOBACCO USE    7. Neoplasm of uncertain behavior of skin  Ambulatory referral to Dermatology   right neck /left back  refer to derm  8. Screening for colon cancer  Ambulatory referral to Gastroenterology  potassium elevation prob from phlebotomy effect .  LIPID no se of med continue FAM HX prostate cancer  monitor  Tobacco Counseled. To stop pt not ready disc chantix Ed basically using 25 mg equivalent ok to refill 90 days  For a year  Contact us for refill. Elevated Bp reading    Disc getting  readings 3 x per week and if not at goal then plan fu visit . Reviewed record nl lv func echo years ago calcium at AV no as no m heard today will follow  ? If had a sig colonic polyps and may be due for colonoscopy the next year  .  Refer for colonoscopy. Skin lesions  Uncertain behavior  could be a large SK but not typical  With scaly base options discussed  Derm referral about this lesion and another  Dark mole on back .   Patient Instructions  Will do referral for colonoscopy   As we discussed . Advise stop tobacco   Contact  us if  We can help.  Will do a referral  For dermatology consult. Continue same meds  Look in to getting shingle vaccine Labs and CPX in a year  Preventive Care for Adults, Male A healthy lifestyle and preventive care can promote health and wellness. Preventive health guidelines for men include the following key practices:  A routine yearly physical is a good way to check with your caregiver about your health and preventative screening. It is a chance to share any concerns and updates on your health, and to receive a thorough exam.  Visit your dentist for a routine exam and preventative care every 6 months. Brush your teeth twice a day and floss once a day. Good oral hygiene prevents tooth decay and gum disease.  The frequency of eye exams is based on your age, health, family medical history, use of contact lenses, and other factors. Follow your caregiver's recommendations for frequency of eye exams.  Eat a healthy diet. Foods like vegetables, fruits, whole grains, low-fat dairy products, and lean protein foods contain the nutrients you need without too many calories. Decrease your intake of foods high in solid fats, added sugars, and salt. Eat the right amount of calories for you.Get information about a proper diet from your caregiver, if necessary.  Regular physical exercise is one of the most important things you can do for your health. Most adults should get at least  150 minutes of moderate-intensity exercise (any activity that increases your heart rate and causes you to sweat) each week. In addition, most adults need muscle-strengthening exercises on 2 or more days a week.  Maintain a healthy weight. The body mass index (BMI) is a screening tool to identify possible weight problems. It provides an estimate of body fat based on height and weight. Your caregiver can help determine your BMI, and can help you achieve or maintain a healthy weight.For adults 20 years and older:  A BMI below 18.5 is considered underweight.  A BMI of 18.5 to 24.9 is normal.  A BMI of 25 to 29.9 is considered overweight.  A BMI of 30 and above is considered obese.  Maintain normal blood lipids and cholesterol levels by exercising and minimizing your intake of saturated fat. Eat a balanced diet with plenty of fruit and vegetables. Blood tests for lipids and cholesterol should begin at age 68 and be repeated every 5 years. If your lipid or cholesterol levels are high, you are over 50, or you are a high risk for heart disease, you may need your cholesterol levels checked more frequently.Ongoing high lipid and cholesterol levels should be treated with medicines if diet and exercise are not effective.  If you smoke, find out from your caregiver how to quit. If you do not use tobacco, do not start.  If you choose to drink alcohol, do not exceed 2 drinks per day. One drink is considered to be 12 ounces (355 mL) of beer, 5 ounces (148 mL) of wine, or 1.5 ounces (44 mL) of liquor.  Avoid use of street drugs. Do not share needles with anyone. Ask for help if you need support or instructions about stopping the use of drugs.  High blood pressure causes heart disease and increases the risk of stroke. Your blood pressure should be checked at least every 1 to 2 years. Ongoing high blood pressure should be treated with medicines, if weight loss and exercise are not effective.  If you are 50 to  66 years old, ask your  caregiver if you should take aspirin to prevent heart disease.  Diabetes screening involves taking a blood sample to check your fasting blood sugar level. This should be done once every 3 years, after age 85, if you are within normal weight and without risk factors for diabetes. Testing should be considered at a younger age or be carried out more frequently if you are overweight and have at least 1 risk factor for diabetes.  Colorectal cancer can be detected and often prevented. Most routine colorectal cancer screening begins at the age of 67 and continues through age 62. However, your caregiver may recommend screening at an earlier age if you have risk factors for colon cancer. On a yearly basis, your caregiver may provide home test kits to check for hidden blood in the stool. Use of a small camera at the end of a tube, to directly examine the colon (sigmoidoscopy or colonoscopy), can detect the earliest forms of colorectal cancer. Talk to your caregiver about this at age 30, when routine screening begins. Direct examination of the colon should be repeated every 5 to 10 years through age 3, unless early forms of pre-cancerous polyps or small growths are found.  Hepatitis C blood testing is recommended for all people born from [birth year] through 1965 and any individual with known risks for hepatitis C.  Practice safe sex. Use condoms and avoid high-risk sexual practices to reduce the spread of sexually transmitted infections (STIs). STIs include gonorrhea, chlamydia, syphilis, trichomonas, herpes, HPV, and human immunodeficiency virus (HIV). Herpes, HIV, and HPV are viral illnesses that have no cure. They can result in disability, cancer, and death.  A one-time screening for abdominal aortic aneurysm (AAA) and surgical repair of large AAAs by sound wave imaging (ultrasonography) is recommended for ages 46 to 89 years who are current or former smokers.  Healthy men should no longer  receive prostate-specific antigen (PSA) blood tests as part of routine cancer screening. Consult with your caregiver about prostate cancer screening.  Testicular cancer screening is not recommended for adult males who have no symptoms. Screening includes self-exam, caregiver exam, and other screening tests. Consult with your caregiver about any symptoms you have or any concerns you have about testicular cancer.  Use sunscreen with skin protection factor (SPF) of 30 or more. Apply sunscreen liberally and repeatedly throughout the day. You should seek shade when your shadow is shorter than you. Protect yourself by wearing long sleeves, pants, a wide-brimmed hat, and sunglasses year round, whenever you are outdoors.  Once a month, do a whole body skin exam, using a mirror to look at the skin on your back. Notify your caregiver of new moles, moles that have irregular borders, moles that are larger than a pencil eraser, or moles that have changed in shape or color.  Stay current with required immunizations.  Influenza. You need a dose every fall (or winter). The composition of the flu vaccine changes each year, so being vaccinated once is not enough.  Pneumococcal polysaccharide. You need 1 to 2 doses if you smoke cigarettes or if you have certain chronic medical conditions. You need 1 dose at age 32 (or older) if you have never been vaccinated.  Tetanus, diphtheria, pertussis (Tdap, Td). Get 1 dose of Tdap vaccine if you are younger than age 33 years, are over 39 and have contact with an infant, are a Research scientist (physical sciences), or simply want to be protected from whooping cough. After that, you need a Td booster dose every 10 years.  Consult your caregiver if you have not had at least 3 tetanus and diphtheria-containing shots sometime in your life or have a deep or dirty wound.  HPV. This vaccine is recommended for males 13 through 66 years of age. This vaccine may be given to men 22 through 66 years of age who  have not completed the 3 dose series. It is recommended for men through age 38 who have sex with men or whose immune system is weakened because of HIV infection, other illness, or medications. The vaccine is given in 3 doses over 6 months.  Measles, mumps, rubella (MMR). You need at least 1 dose of MMR if you were born in 1957 or later. You may also need a 2nd dose.  Meningococcal. If you are age 60 to 70 years and a Orthoptist living in a residence hall, or have one of several medical conditions, you need to get vaccinated against meningococcal disease. You may also need additional booster doses.  Zoster (shingles). If you are age 72 years or older, you should get this vaccine.  Varicella (chickenpox). If you have never had chickenpox or you were vaccinated but received only 1 dose, talk to your caregiver to find out if you need this vaccine.  Hepatitis A. You need this vaccine if you have a specific risk factor for hepatitis A virus infection, or you simply wish to be protected from this disease. The vaccine is usually given as 2 doses, 6 to 18 months apart.  Hepatitis B. You need this vaccine if you have a specific risk factor for hepatitis B virus infection or you simply wish to be protected from this disease. The vaccine is given in 3 doses, usually over 6 months. Preventative Service / Frequency Ages 31 to 19  Blood pressure check.** / Every 1 to 2 years.  Lipid and cholesterol check.** / Every 5 years beginning at age 22.  Hepatitis C blood test.** / For any individual with known risks for hepatitis C.  Skin self-exam. / Monthly.  Influenza immunization.** / Every year.  Pneumococcal polysaccharide immunization.** / 1 to 2 doses if you smoke cigarettes or if you have certain chronic medical conditions.  Tetanus, diphtheria, pertussis (Tdap,Td) immunization. / A one-time dose of Tdap vaccine. After that, you need a Td booster dose every 10 years.  HPV immunization.  / 3 doses over 6 months, if 26 and younger.  Measles, mumps, rubella (MMR) immunization. / You need at least 1 dose of MMR if you were born in 1957 or later. You may also need a 2nd dose.  Meningococcal immunization. / 1 dose if you are age 65 to 18 years and a Orthoptist living in a residence hall, or have one of several medical conditions, you need to get vaccinated against meningococcal disease. You may also need additional booster doses.  Varicella immunization.** / Consult your caregiver.  Hepatitis A immunization.** / Consult your caregiver. 2 doses, 6 to 18 months apart.  Hepatitis B immunization.** / Consult your caregiver. 3 doses usually over 6 months. Ages 40 to 28  Blood pressure check.** / Every 1 to 2 years.  Lipid and cholesterol check.** / Every 5 years beginning at age 71.  Fecal occult blood test (FOBT) of stool. / Every year beginning at age 70 and continuing until age 37. You may not have to do this test if you get colonoscopy every 10 years.  Flexible sigmoidoscopy** or colonoscopy.** / Every 5 years for a flexible sigmoidoscopy  or every 10 years for a colonoscopy beginning at age 29 and continuing until age 79.  Hepatitis C blood test.** / For all people born from [birth year] through 1965 and any individual with known risks for hepatitis C.  Skin self-exam. / Monthly.  Influenza immunization.** / Every year.  Pneumococcal polysaccharide immunization.** / 1 to 2 doses if you smoke cigarettes or if you have certain chronic medical conditions.  Tetanus, diphtheria, pertussis (Tdap/Td) immunization.** / A one-time dose of Tdap vaccine. After that, you need a Td booster dose every 10 years.  Measles, mumps, rubella (MMR) immunization. / You need at least 1 dose of MMR if you were born in 1957 or later. You may also need a 2nd dose.  Varicella immunization.**/ Consult your caregiver.  Meningococcal immunization.** / Consult your caregiver.  Hepatitis A  immunization.** / Consult your caregiver. 2 doses, 6 to 18 months apart.  Hepatitis B immunization.** / Consult your caregiver. 3 doses, usually over 6 months. Ages 76 and over  Blood pressure check.** / Every 1 to 2 years.  Lipid and cholesterol check.**/ Every 5 years beginning at age 47.  Fecal occult blood test (FOBT) of stool. / Every year beginning at age 35 and continuing until age 6. You may not have to do this test if you get colonoscopy every 10 years.  Flexible sigmoidoscopy** or colonoscopy.** / Every 5 years for a flexible sigmoidoscopy or every 10 years for a colonoscopy beginning at age 87 and continuing until age 81.  Hepatitis C blood test.** / For all people born from [birth year] through 1965 and any individual with known risks for hepatitis C.  Abdominal aortic aneurysm (AAA) screening.** / A one-time screening for ages 64 to 14 years who are current or former smokers.  Skin self-exam. / Monthly.  Influenza immunization.** / Every year.  Pneumococcal polysaccharide immunization.** / 1 dose at age 24 (or older) if you have never been vaccinated.  Tetanus, diphtheria, pertussis (Tdap, Td) immunization. / A one-time dose of Tdap vaccine if you are over 65 and have contact with an infant, are a Research scientist (physical sciences), or simply want to be protected from whooping cough. After that, you need a Td booster dose every 10 years.  Varicella immunization. ** / Consult your caregiver.  Meningococcal immunization.** / Consult your caregiver.  Hepatitis A immunization. ** / Consult your caregiver. 2 doses, 6 to 18 months apart.  Hepatitis B immunization.** / Check with your caregiver. 3 doses, usually over 6 months. **Family history and personal history of risk and conditions may change your caregiver's recommendations. Document Released: 07/17/2001 Document Revised: 08/13/2011 Document Reviewed: 10/16/2010 [facility] Patient Information 2013 [place], [place].      Lorretta Harp

## 2012-04-09 NOTE — Patient Instructions (Signed)
Will do referral for colonoscopy   As we discussed . Advise stop tobacco   Contact us if  We can help.  Will do a referral  For dermatology consult. Continue same meds  Look in to getting shingle vaccine Labs and CPX in a year  Preventive Care for Adults, Male A healthy lifestyle and preventive care can promote health and wellness. Preventive health guidelines for men include the following key practices:  A routine yearly physical is a good way to check with your caregiver about your health and preventative screening. It is a chance to share any concerns and updates on your health, and to receive a thorough exam.  Visit your dentist for a routine exam and preventative care every 6 months. Brush your teeth twice a day and floss once a day. Good oral hygiene prevents tooth decay and gum disease.  The frequency of eye exams is based on your age, health, family medical history, use of contact lenses, and other factors. Follow your caregiver's recommendations for frequency of eye exams.  Eat a healthy diet. Foods like vegetables, fruits, whole grains, low-fat dairy products, and lean protein foods contain the nutrients you need without too many calories. Decrease your intake of foods high in solid fats, added sugars, and salt. Eat the right amount of calories for you.Get information about a proper diet from your caregiver, if necessary.  Regular physical exercise is one of the most important things you can do for your health. Most adults should get at least 150 minutes of moderate-intensity exercise (any activity that increases your heart rate and causes you to sweat) each week. In addition, most adults need muscle-strengthening exercises on 2 or more days a week.  Maintain a healthy weight. The body mass index (BMI) is a screening tool to identify possible weight problems. It provides an estimate of body fat based on height and weight. Your caregiver can help determine your BMI, and can help you  achieve or maintain a healthy weight.For adults 20 years and older:  A BMI below 18.5 is considered underweight.  A BMI of 18.5 to 24.9 is normal.  A BMI of 25 to 29.9 is considered overweight.  A BMI of 30 and above is considered obese.  Maintain normal blood lipids and cholesterol levels by exercising and minimizing your intake of saturated fat. Eat a balanced diet with plenty of fruit and vegetables. Blood tests for lipids and cholesterol should begin at age 46 and be repeated every 5 years. If your lipid or cholesterol levels are high, you are over 50, or you are a high risk for heart disease, you may need your cholesterol levels checked more frequently.Ongoing high lipid and cholesterol levels should be treated with medicines if diet and exercise are not effective.  If you smoke, find out from your caregiver how to quit. If you do not use tobacco, do not start.  If you choose to drink alcohol, do not exceed 2 drinks per day. One drink is considered to be 12 ounces (355 mL) of beer, 5 ounces (148 mL) of wine, or 1.5 ounces (44 mL) of liquor.  Avoid use of street drugs. Do not share needles with anyone. Ask for help if you need support or instructions about stopping the use of drugs.  High blood pressure causes heart disease and increases the risk of stroke. Your blood pressure should be checked at least every 1 to 2 years. Ongoing high blood pressure should be treated with medicines, if weight loss  and exercise are not effective.  If you are 79 to 66 years old, ask your caregiver if you should take aspirin to prevent heart disease.  Diabetes screening involves taking a blood sample to check your fasting blood sugar level. This should be done once every 3 years, after age 64, if you are within normal weight and without risk factors for diabetes. Testing should be considered at a younger age or be carried out more frequently if you are overweight and have at least 1 risk factor for  diabetes.  Colorectal cancer can be detected and often prevented. Most routine colorectal cancer screening begins at the age of 55 and continues through age 51. However, your caregiver may recommend screening at an earlier age if you have risk factors for colon cancer. On a yearly basis, your caregiver may provide home test kits to check for hidden blood in the stool. Use of a small camera at the end of a tube, to directly examine the colon (sigmoidoscopy or colonoscopy), can detect the earliest forms of colorectal cancer. Talk to your caregiver about this at age 74, when routine screening begins. Direct examination of the colon should be repeated every 5 to 10 years through age 22, unless early forms of pre-cancerous polyps or small growths are found.  Hepatitis C blood testing is recommended for all people born from [birth year] through 1965 and any individual with known risks for hepatitis C.  Practice safe sex. Use condoms and avoid high-risk sexual practices to reduce the spread of sexually transmitted infections (STIs). STIs include gonorrhea, chlamydia, syphilis, trichomonas, herpes, HPV, and human immunodeficiency virus (HIV). Herpes, HIV, and HPV are viral illnesses that have no cure. They can result in disability, cancer, and death.  A one-time screening for abdominal aortic aneurysm (AAA) and surgical repair of large AAAs by sound wave imaging (ultrasonography) is recommended for ages 35 to 28 years who are current or former smokers.  Healthy men should no longer receive prostate-specific antigen (PSA) blood tests as part of routine cancer screening. Consult with your caregiver about prostate cancer screening.  Testicular cancer screening is not recommended for adult males who have no symptoms. Screening includes self-exam, caregiver exam, and other screening tests. Consult with your caregiver about any symptoms you have or any concerns you have about testicular cancer.  Use sunscreen with skin  protection factor (SPF) of 30 or more. Apply sunscreen liberally and repeatedly throughout the day. You should seek shade when your shadow is shorter than you. Protect yourself by wearing long sleeves, pants, a wide-brimmed hat, and sunglasses year round, whenever you are outdoors.  Once a month, do a whole body skin exam, using a mirror to look at the skin on your back. Notify your caregiver of new moles, moles that have irregular borders, moles that are larger than a pencil eraser, or moles that have changed in shape or color.  Stay current with required immunizations.  Influenza. You need a dose every fall (or winter). The composition of the flu vaccine changes each year, so being vaccinated once is not enough.  Pneumococcal polysaccharide. You need 1 to 2 doses if you smoke cigarettes or if you have certain chronic medical conditions. You need 1 dose at age 60 (or older) if you have never been vaccinated.  Tetanus, diphtheria, pertussis (Tdap, Td). Get 1 dose of Tdap vaccine if you are younger than age 91 years, are over 82 and have contact with an infant, are a Research scientist (physical sciences), or simply want  to be protected from whooping cough. After that, you need a Td booster dose every 10 years. Consult your caregiver if you have not had at least 3 tetanus and diphtheria-containing shots sometime in your life or have a deep or dirty wound.  HPV. This vaccine is recommended for males 13 through 66 years of age. This vaccine may be given to men 22 through 66 years of age who have not completed the 3 dose series. It is recommended for men through age 57 who have sex with men or whose immune system is weakened because of HIV infection, other illness, or medications. The vaccine is given in 3 doses over 6 months.  Measles, mumps, rubella (MMR). You need at least 1 dose of MMR if you were born in 1957 or later. You may also need a 2nd dose.  Meningococcal. If you are age 75 to 3 years and a Dealer living in a residence hall, or have one of several medical conditions, you need to get vaccinated against meningococcal disease. You may also need additional booster doses.  Zoster (shingles). If you are age 37 years or older, you should get this vaccine.  Varicella (chickenpox). If you have never had chickenpox or you were vaccinated but received only 1 dose, talk to your caregiver to find out if you need this vaccine.  Hepatitis A. You need this vaccine if you have a specific risk factor for hepatitis A virus infection, or you simply wish to be protected from this disease. The vaccine is usually given as 2 doses, 6 to 18 months apart.  Hepatitis B. You need this vaccine if you have a specific risk factor for hepatitis B virus infection or you simply wish to be protected from this disease. The vaccine is given in 3 doses, usually over 6 months. Preventative Service / Frequency Ages 61 to 75  Blood pressure check.** / Every 1 to 2 years.  Lipid and cholesterol check.** / Every 5 years beginning at age 20.  Hepatitis C blood test.** / For any individual with known risks for hepatitis C.  Skin self-exam. / Monthly.  Influenza immunization.** / Every year.  Pneumococcal polysaccharide immunization.** / 1 to 2 doses if you smoke cigarettes or if you have certain chronic medical conditions.  Tetanus, diphtheria, pertussis (Tdap,Td) immunization. / A one-time dose of Tdap vaccine. After that, you need a Td booster dose every 10 years.  HPV immunization. / 3 doses over 6 months, if 26 and younger.  Measles, mumps, rubella (MMR) immunization. / You need at least 1 dose of MMR if you were born in 1957 or later. You may also need a 2nd dose.  Meningococcal immunization. / 1 dose if you are age 26 to 26 years and a Orthoptist living in a residence hall, or have one of several medical conditions, you need to get vaccinated against meningococcal disease. You may also need  additional booster doses.  Varicella immunization.** / Consult your caregiver.  Hepatitis A immunization.** / Consult your caregiver. 2 doses, 6 to 18 months apart.  Hepatitis B immunization.** / Consult your caregiver. 3 doses usually over 6 months. Ages 74 to 34  Blood pressure check.** / Every 1 to 2 years.  Lipid and cholesterol check.** / Every 5 years beginning at age 15.  Fecal occult blood test (FOBT) of stool. / Every year beginning at age 89 and continuing until age 74. You may not have to do this test if you get  colonoscopy every 10 years.  Flexible sigmoidoscopy** or colonoscopy.** / Every 5 years for a flexible sigmoidoscopy or every 10 years for a colonoscopy beginning at age 12 and continuing until age 98.  Hepatitis C blood test.** / For all people born from [birth year] through 1965 and any individual with known risks for hepatitis C.  Skin self-exam. / Monthly.  Influenza immunization.** / Every year.  Pneumococcal polysaccharide immunization.** / 1 to 2 doses if you smoke cigarettes or if you have certain chronic medical conditions.  Tetanus, diphtheria, pertussis (Tdap/Td) immunization.** / A one-time dose of Tdap vaccine. After that, you need a Td booster dose every 10 years.  Measles, mumps, rubella (MMR) immunization. / You need at least 1 dose of MMR if you were born in 1957 or later. You may also need a 2nd dose.  Varicella immunization.**/ Consult your caregiver.  Meningococcal immunization.** / Consult your caregiver.  Hepatitis A immunization.** / Consult your caregiver. 2 doses, 6 to 18 months apart.  Hepatitis B immunization.** / Consult your caregiver. 3 doses, usually over 6 months. Ages 51 and over  Blood pressure check.** / Every 1 to 2 years.  Lipid and cholesterol check.**/ Every 5 years beginning at age 14.  Fecal occult blood test (FOBT) of stool. / Every year beginning at age 74 and continuing until age 98. You may not have to do this test if  you get colonoscopy every 10 years.  Flexible sigmoidoscopy** or colonoscopy.** / Every 5 years for a flexible sigmoidoscopy or every 10 years for a colonoscopy beginning at age 65 and continuing until age 12.  Hepatitis C blood test.** / For all people born from [birth year] through 1965 and any individual with known risks for hepatitis C.  Abdominal aortic aneurysm (AAA) screening.** / A one-time screening for ages 46 to 38 years who are current or former smokers.  Skin self-exam. / Monthly.  Influenza immunization.** / Every year.  Pneumococcal polysaccharide immunization.** / 1 dose at age 49 (or older) if you have never been vaccinated.  Tetanus, diphtheria, pertussis (Tdap, Td) immunization. / A one-time dose of Tdap vaccine if you are over 65 and have contact with an infant, are a Research scientist (physical sciences), or simply want to be protected from whooping cough. After that, you need a Td booster dose every 10 years.  Varicella immunization. ** / Consult your caregiver.  Meningococcal immunization.** / Consult your caregiver.  Hepatitis A immunization. ** / Consult your caregiver. 2 doses, 6 to 18 months apart.  Hepatitis B immunization.** / Check with your caregiver. 3 doses, usually over 6 months. **Family history and personal history of risk and conditions may change your caregiver's recommendations. Document Released: 07/17/2001 Document Revised: 08/13/2011 Document Reviewed: 10/16/2010 [facility] Patient Information 2013 [place], [place].

## 2012-04-10 ENCOUNTER — Encounter: Payer: Self-pay | Admitting: Internal Medicine

## 2012-04-30 ENCOUNTER — Telehealth: Payer: Self-pay | Admitting: Internal Medicine

## 2012-04-30 NOTE — Telephone Encounter (Signed)
Pt returning call back from nurse. Pls call on Friday 05/02/12.

## 2012-05-02 ENCOUNTER — Telehealth: Payer: Self-pay | Admitting: Family Medicine

## 2012-05-02 NOTE — Telephone Encounter (Signed)
Message copied by Nils Flack on Fri May 02, 2012 11:04 AM ------      Message from: [facility] [place] K      Created: Thu Apr 10, 2012  6:49 PM      Regarding: prevnar or pneumovax       Forgot to advise patient that he should have a pneumococcal vaccine  Also  Please tellpt he should come in for prevnar 13 or pneumovax 23            Thanks       WP.

## 2012-05-02 NOTE — Telephone Encounter (Signed)
Pt notified by telephone.  He will call back to make appt when he is available.

## 2012-06-16 ENCOUNTER — Other Ambulatory Visit: Payer: Self-pay | Admitting: Family Medicine

## 2012-06-16 MED ORDER — LOVASTATIN 40 MG PO TABS: 40.0000 mg | ORAL_TABLET | Freq: Every day | ORAL | Status: DC

## 2013-03-30 ENCOUNTER — Other Ambulatory Visit: Payer: Self-pay | Admitting: Family Medicine

## 2013-03-30 ENCOUNTER — Telehealth: Payer: Self-pay | Admitting: Internal Medicine

## 2013-03-30 DIAGNOSIS — Z0000 Encounter for general adult medical examination without abnormal findings: Secondary | ICD-10-CM

## 2013-03-30 NOTE — Telephone Encounter (Signed)
Pt states there should be a standing order to have lipid and PSA checked every 6 mnths and is calling to schedule lab appt.  Advised pt I do not see a standing order in system but will send a request to PCP for an order and call him back once order has been provided.

## 2013-03-30 NOTE — Telephone Encounter (Signed)
Pt is due for CPE and labs.  No standing order mentioned in the note.  Only said to return in 1 year for labs and CPE.  Please schedule the pt an appt.  May use Tues or Wed if necessary.  Please do not book beside another CPE or Medicare Wellness visit.  I will put in orders for lab work.

## 2013-04-01 NOTE — Telephone Encounter (Signed)
Pt has been sch

## 2013-04-20 ENCOUNTER — Other Ambulatory Visit: Payer: Self-pay | Admitting: Family Medicine

## 2013-04-20 MED ORDER — LOVASTATIN 40 MG PO TABS: 40.0000 mg | ORAL_TABLET | Freq: Every day | ORAL | Status: DC

## 2013-04-22 ENCOUNTER — Other Ambulatory Visit: Payer: BC Managed Care – PPO

## 2013-04-27 ENCOUNTER — Other Ambulatory Visit (INDEPENDENT_AMBULATORY_CARE_PROVIDER_SITE_OTHER): Payer: BC Managed Care – PPO

## 2013-04-27 DIAGNOSIS — Z0000 Encounter for general adult medical examination without abnormal findings: Secondary | ICD-10-CM

## 2013-04-27 LAB — HEPATIC FUNCTION PANEL
ALT: 29 U/L (ref 0–53)
AST: 27 U/L (ref 0–37)
Albumin: 3.9 g/dL (ref 3.5–5.2)
Alkaline Phosphatase: 54 U/L (ref 39–117)
Bilirubin, Direct: 0.2 mg/dL (ref 0.0–0.3)
Total Bilirubin: 1 mg/dL (ref 0.3–1.2)
Total Protein: 6.7 g/dL (ref 6.0–8.3)

## 2013-04-27 LAB — CBC WITH DIFFERENTIAL/PLATELET
Basophils Absolute: 0 10*3/uL (ref 0.0–0.1)
Basophils Relative: 0.4 % (ref 0.0–3.0)
Eosinophils Absolute: 0.2 10*3/uL (ref 0.0–0.7)
Eosinophils Relative: 2.9 % (ref 0.0–5.0)
HCT: 45.2 % (ref 39.0–52.0)
Hemoglobin: 15 g/dL (ref 13.0–17.0)
Lymphocytes Relative: 26.6 % (ref 12.0–46.0)
Lymphs Abs: 2.1 10*3/uL (ref 0.7–4.0)
MCHC: 33.1 g/dL (ref 30.0–36.0)
MCV: 93.5 fl (ref 78.0–100.0)
Monocytes Absolute: 0.7 10*3/uL (ref 0.1–1.0)
Monocytes Relative: 9.3 % (ref 3.0–12.0)
Neutro Abs: 4.8 10*3/uL (ref 1.4–7.7)
Neutrophils Relative %: 60.8 % (ref 43.0–77.0)
Platelets: 325 10*3/uL (ref 150.0–400.0)
RBC: 4.83 Mil/uL (ref 4.22–5.81)
RDW: 13.9 % (ref 11.5–14.6)
WBC: 7.9 10*3/uL (ref 4.5–10.5)

## 2013-04-27 LAB — LIPID PANEL
Cholesterol: 153 mg/dL (ref 0–200)
HDL: 50.1 mg/dL (ref >39.00)
LDL Cholesterol: 90 mg/dL (ref 0–99)
Total CHOL/HDL Ratio: 3
Triglycerides: 63 mg/dL (ref 0.0–149.0)
VLDL: 12.6 mg/dL (ref 0.0–40.0)

## 2013-04-27 LAB — TSH: TSH: 1.16 u[IU]/mL (ref 0.35–5.50)

## 2013-04-27 LAB — BASIC METABOLIC PANEL
BUN: 18 mg/dL (ref 6–23)
CO2: 29 mEq/L (ref 19–32)
Calcium: 9.2 mg/dL (ref 8.4–10.5)
Chloride: 104 mEq/L (ref 96–112)
Creatinine, Ser: 0.9 mg/dL (ref 0.4–1.5)
GFR: 87.07 mL/min (ref >60.00)
Glucose, Bld: 84 mg/dL (ref 70–99)
Potassium: 4.5 mEq/L (ref 3.5–5.1)
Sodium: 140 mEq/L (ref 135–145)

## 2013-04-27 LAB — PSA: PSA: 1.82 ng/mL (ref 0.10–4.00)

## 2013-05-12 ENCOUNTER — Ambulatory Visit (INDEPENDENT_AMBULATORY_CARE_PROVIDER_SITE_OTHER): Payer: BC Managed Care – PPO | Admitting: Internal Medicine

## 2013-05-12 ENCOUNTER — Encounter: Payer: Self-pay | Admitting: Internal Medicine

## 2013-05-12 VITALS — BP 134/72 | HR 71 | Temp 98.0°F | Ht 63.75 in | Wt 145.0 lb

## 2013-05-12 DIAGNOSIS — F17200 Nicotine dependence, unspecified, uncomplicated: Secondary | ICD-10-CM

## 2013-05-12 DIAGNOSIS — Z0000 Encounter for general adult medical examination without abnormal findings: Secondary | ICD-10-CM

## 2013-05-12 DIAGNOSIS — E785 Hyperlipidemia, unspecified: Secondary | ICD-10-CM

## 2013-05-12 DIAGNOSIS — Z8042 Family history of malignant neoplasm of prostate: Secondary | ICD-10-CM

## 2013-05-12 DIAGNOSIS — Z23 Encounter for immunization: Secondary | ICD-10-CM

## 2013-05-12 MED ORDER — SILDENAFIL CITRATE 50 MG PO TABS: 50.0000 mg | ORAL_TABLET | ORAL | Status: DC | PRN

## 2013-05-12 MED ORDER — LOVASTATIN 40 MG PO TABS: 40.0000 mg | ORAL_TABLET | Freq: Every day | ORAL | Status: DC

## 2013-05-12 NOTE — Progress Notes (Signed)
Chief Complaint  Patient presents with  . Annual Exam    HPI: Patient comes in today for Preventive Health Care visit  No major change in health status since last visit . bp machine at home  High 130/140s below 80. So stopped taking it because it was okay LIPIDS no se of meds continues medication He is refill of Viagra no obstructive prostate symptoms per patient. No tobacco in house about 1ppd   Some at work. Trying to cut down but still smoking etoh  1 per day wine  caffien in am . No sugar.   Health Maintenance  Topic Date Due  . Zostavax  10/05/2005  . Pneumococcal Polysaccharide Vaccine Age 104 And Over  10/06/2010  . Colonoscopy  06/06/2013  . Influenza Vaccine  01/02/2014  . Tetanus/tdap  04/26/2020   Health Maintenance Review   ROS:  GEN/ HEENT: No fever, significant weight changes sweats headaches vision problems hearing changes, CV/ PULM; No chest pain shortness of breath cough, syncope,edema  change in exercise tolerance. GI /GU: No adominal pain, vomiting, change in bowel habits. No blood in the stool. No significant GU symptoms. SKIN/HEME: ,no acute skin rashes suspicious lesions or bleeding. No lymphadenopathy, nodules, masses.  NEURO/ PSYCH:  No neurologic signs such as weakness numbness. No depression anxiety. IMM/ Allergy: No unusual infections.  Allergy .   REST of 12 system review negative except as per HPI   Past Medical History  Diagnosis Date  . Hyperlipidemia   . Headache(784.0)   . ED (erectile dysfunction)   . Syncope   . Osteopenia   . H/O echocardiogram 2/10    ef 50 -60 mildly calcific aortic valve    Family History  Problem Relation Age of Onset  . Cancer Father     Prostate  . Cancer Brother     Prostate  . Parkinsonism Mother     deceased 65s     History   Social History  . Marital Status: Married    Spouse Name: April Gahan    Number of Children: 4  . Years of Education: N/A   Social History Main Topics  . Smoking  status: Current Every Day Smoker  . Smokeless tobacco: None  . Alcohol Use: Yes  . Drug Use: None  . Sexual Activity: None   Other Topics Concern  . 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 lovastatin (MEVACOR) 40 MG tablet Take 1 tablet (40 mg total) by mouth at bedtime.  . sildenafil (VIAGRA) 50 MG tablet Take 1 tablet (50 mg total) by mouth as needed for erectile dysfunction.  . [DISCONTINUED] lovastatin (MEVACOR) 40 MG tablet Take 1 tablet (40 mg total) by mouth at bedtime.  . [DISCONTINUED] sildenafil (VIAGRA) 50 MG tablet Take 1 tablet (50 mg total) by mouth as needed for erectile dysfunction.    EXAM:  BP 134/72  Pulse 71  Temp(Src) 98 F (36.7 C) (Oral)  Ht 5' 3.75" (1.619 m)  Wt 145 lb (65.772 kg)  BMI 25.09 kg/m2  SpO2 97%  Body mass index is 25.09 kg/(m^2).  Physical Exam: Vital signs reviewed [id number] is a well-developed well-nourished alert cooperative   male who appears  stated age in no acute distress.  HEENT: normocephalic atraumatic ,  Eyes: PERRL EOM's full, conjunctiva clear, wears glasses Nares: paten,t no deformity discharge or tenderness., Ears: no deformity EAC's clear TMs with normal landmarks. Mouth: clear OP, no lesions, edema.  Moist mucous membranes. Dentition in adequate repair. NECK: supple without masses, thyromegaly or bruits. CHEST/PULM:  Clear to auscultation and percussion breath sounds equal no wheeze , rales or rhonchi. No chest wall deformities or tenderness. CV: PMI is nondisplaced, S1 S2 no gallops, murmurs, rubs. Peripheral pulses are full without delay.No JVD .  ABDOMEN: Bowel sounds normal nontender  No guard or rebound, no hepato splenomegal no CVA tenderness.   Extremtities:  No clubbing cyanosis or edema, no acute joint swelling or redness no focal atrophy NEURO:   Oriented x3, cranial nerves 3-12 appear to be intact, no obvious focal weakness,gait within normal limits no abnormal reflexes or asymmetrical SKIN: No acute rashes normal turgor, color, no bruising or petechiae. PSYCH: Oriented, good eye contact, no obvious depression anxiety, cognition and judgment appear normal. LN: no cervical axillary inguinal l adenopathy  Lab Results  Component Value Date   WBC 7.9 04/27/2013   HGB 15.0 04/27/2013   HCT 45.2 04/27/2013   PLT 325.0 04/27/2013   GLUCOSE 84 04/27/2013   CHOL 153 04/27/2013   TRIG 63.0 04/27/2013   HDL 50.10 04/27/2013   LDLDIRECT 152.3 04/07/2007   LDLCALC 90 04/27/2013   ALT 29 04/27/2013   AST 27 04/27/2013   NA 140 04/27/2013   K 4.5 04/27/2013   CL 104 04/27/2013   CREATININE 0.9 04/27/2013   BUN 18 04/27/2013   CO2 29 04/27/2013   TSH 1.16 04/27/2013   PSA 1.82 04/27/2013    ASSESSMENT AND PLAN:  Discussed the following assessment and plan:  Visit for preventive health examination - We'll check on shingles vaccine reimbursement.  Tobacco use disorder - counseled advice cessation for health reasons  Other and unspecified hyperlipidemia - Good levels continue  Need for vaccination with 13-polyvalent pneumococcal conjugate vaccine - Plan: Pneumococcal conjugate vaccine 13-valent  Family hx of prostate cancer  Patient Care Team: Madelin Headings, MD as PCP - General Patient Instructions  150 minutes of exercise weeks  ,weight  healthy levels. Avoid trans fats and processed foods;  Increase fresh fruits and veges to 5 servings per day. And avoid sweet beverages  Including tea and juice. Stop tobacco use .  Check into insurance  About  Shingles vaccine .  Can get this any time if call ahead don't need ov.  Wellness visit in year  Make sure bvp is below 140/90   Otherwise we may add medicine.      Neta Mends. Panosh M.D.   Pre visit review using our clinic review tool, if applicable. No additional management  support is needed unless otherwise documented below in the visit note.

## 2013-05-12 NOTE — Patient Instructions (Signed)
150 minutes of exercise weeks  ,weight  healthy levels. Avoid trans fats and processed foods;  Increase fresh fruits and veges to 5 servings per day. And avoid sweet beverages  Including tea and juice. Stop tobacco use .  Check into insurance  About  Shingles vaccine .  Can get this any time if call ahead don't need ov.  Wellness visit in year  Make sure bvp is below 140/90   Otherwise we may add medicine.

## 2014-04-08 ENCOUNTER — Telehealth: Payer: Self-pay | Admitting: Internal Medicine

## 2014-04-08 ENCOUNTER — Other Ambulatory Visit: Payer: Self-pay | Admitting: Family Medicine

## 2014-04-08 DIAGNOSIS — Z0000 Encounter for general adult medical examination without abnormal findings: Secondary | ICD-10-CM

## 2014-04-08 DIAGNOSIS — N529 Male erectile dysfunction, unspecified: Secondary | ICD-10-CM

## 2014-04-08 NOTE — Telephone Encounter (Signed)
Orders placed.

## 2014-04-08 NOTE — Telephone Encounter (Signed)
Pt would like psa labs done at his cpe on 2/10. Can you put the order in?

## 2014-06-11 ENCOUNTER — Other Ambulatory Visit: Payer: Self-pay | Admitting: Internal Medicine

## 2014-06-17 ENCOUNTER — Other Ambulatory Visit: Payer: Self-pay

## 2014-06-17 MED ORDER — SILDENAFIL CITRATE 50 MG PO TABS: 50.0000 mg | ORAL_TABLET | ORAL | Status: DC | PRN

## 2014-06-30 ENCOUNTER — Other Ambulatory Visit: Payer: BC Managed Care – PPO

## 2014-07-01 ENCOUNTER — Other Ambulatory Visit (INDEPENDENT_AMBULATORY_CARE_PROVIDER_SITE_OTHER): Payer: 59

## 2014-07-01 DIAGNOSIS — Z0000 Encounter for general adult medical examination without abnormal findings: Secondary | ICD-10-CM

## 2014-07-01 DIAGNOSIS — N529 Male erectile dysfunction, unspecified: Secondary | ICD-10-CM

## 2014-07-01 LAB — CBC WITH DIFFERENTIAL/PLATELET
Basophils Absolute: 0 10*3/uL (ref 0.0–0.1)
Basophils Relative: 0.6 % (ref 0.0–3.0)
Eosinophils Absolute: 0.2 10*3/uL (ref 0.0–0.7)
Eosinophils Relative: 2.8 % (ref 0.0–5.0)
HCT: 45 % (ref 39.0–52.0)
Hemoglobin: 15.2 g/dL (ref 13.0–17.0)
Lymphocytes Relative: 27.4 % (ref 12.0–46.0)
Lymphs Abs: 2.1 10*3/uL (ref 0.7–4.0)
MCHC: 33.8 g/dL (ref 30.0–36.0)
MCV: 92.5 fl (ref 78.0–100.0)
Monocytes Absolute: 0.7 10*3/uL (ref 0.1–1.0)
Monocytes Relative: 8.4 % (ref 3.0–12.0)
Neutro Abs: 4.7 10*3/uL (ref 1.4–7.7)
Neutrophils Relative %: 60.8 % (ref 43.0–77.0)
Platelets: 349 10*3/uL (ref 150.0–400.0)
RBC: 4.87 Mil/uL (ref 4.22–5.81)
RDW: 14.3 % (ref 11.5–15.5)
WBC: 7.8 10*3/uL (ref 4.0–10.5)

## 2014-07-01 LAB — BASIC METABOLIC PANEL
BUN: 15 mg/dL (ref 6–23)
CO2: 30 mEq/L (ref 19–32)
Calcium: 9.2 mg/dL (ref 8.4–10.5)
Chloride: 106 mEq/L (ref 96–112)
Creatinine, Ser: 0.94 mg/dL (ref 0.40–1.50)
GFR: 84.64 mL/min (ref >60.00)
Glucose, Bld: 91 mg/dL (ref 70–99)
Potassium: 4.7 mEq/L (ref 3.5–5.1)
Sodium: 142 mEq/L (ref 135–145)

## 2014-07-01 LAB — LIPID PANEL
Cholesterol: 160 mg/dL (ref 0–200)
HDL: 53.9 mg/dL (ref >39.00)
LDL Cholesterol: 84 mg/dL (ref 0–99)
NonHDL: 106.1
Total CHOL/HDL Ratio: 3
Triglycerides: 112 mg/dL (ref 0.0–149.0)
VLDL: 22.4 mg/dL (ref 0.0–40.0)

## 2014-07-01 LAB — HEPATIC FUNCTION PANEL
ALT: 28 U/L (ref 0–53)
AST: 25 U/L (ref 0–37)
Albumin: 3.9 g/dL (ref 3.5–5.2)
Alkaline Phosphatase: 58 U/L (ref 39–117)
Bilirubin, Direct: 0.2 mg/dL (ref 0.0–0.3)
Total Bilirubin: 0.9 mg/dL (ref 0.2–1.2)
Total Protein: 6.5 g/dL (ref 6.0–8.3)

## 2014-07-01 LAB — TSH: TSH: 0.97 u[IU]/mL (ref 0.35–4.50)

## 2014-07-01 LAB — PSA: PSA: 2.9 ng/mL (ref 0.10–4.00)

## 2014-07-14 ENCOUNTER — Encounter: Payer: Self-pay | Admitting: Internal Medicine

## 2014-07-14 ENCOUNTER — Ambulatory Visit (INDEPENDENT_AMBULATORY_CARE_PROVIDER_SITE_OTHER): Payer: 59 | Admitting: Internal Medicine

## 2014-07-14 VITALS — BP 133/83 | Temp 97.8°F | Ht 63.5 in | Wt 141.3 lb

## 2014-07-14 DIAGNOSIS — F17200 Nicotine dependence, unspecified, uncomplicated: Secondary | ICD-10-CM

## 2014-07-14 DIAGNOSIS — I10 Essential (primary) hypertension: Secondary | ICD-10-CM

## 2014-07-14 DIAGNOSIS — E785 Hyperlipidemia, unspecified: Secondary | ICD-10-CM

## 2014-07-14 DIAGNOSIS — Z0000 Encounter for general adult medical examination without abnormal findings: Secondary | ICD-10-CM

## 2014-07-14 DIAGNOSIS — Z8042 Family history of malignant neoplasm of prostate: Secondary | ICD-10-CM

## 2014-07-14 DIAGNOSIS — Z23 Encounter for immunization: Secondary | ICD-10-CM

## 2014-07-14 DIAGNOSIS — Z720 Tobacco use: Secondary | ICD-10-CM

## 2014-07-14 DIAGNOSIS — Z1211 Encounter for screening for malignant neoplasm of colon: Secondary | ICD-10-CM

## 2014-07-14 MED ORDER — LISINOPRIL 5 MG PO TABS: 5.0000 mg | ORAL_TABLET | Freq: Every day | ORAL | Status: DC

## 2014-07-14 NOTE — Patient Instructions (Addendum)
Take blood pressure readings twice a day for about 10 days and then periodically .To ensure below 140/90   .Send in readings    . If not below 140/90 we should begin medication.  And then FU with readings in 2-3 months .       lifestyle intervention healthy eating and exercise . Healthy lifestyle includes : At least 150 minutes of exercise weeks  , weight at healthy levels, which is usually   BMI 19-25. Avoid trans fats and processed foods;  Increase fresh fruits and veges to 5 servings per day. And avoid sweet beverages including tea and juice. Mediterranean diet with olive oil and nuts have been noted to be heart and brain healthy . Avoid tobacco products . Limit  alcohol to  7 per week for women and 14 servings for men.  Get adequate sleep . Wear seat belts . Don't text and drive .     Hypertension Hypertension, commonly called high blood pressure, is when the force of blood pumping through your arteries is too strong. Your arteries are the blood vessels that carry blood from your heart throughout your body. A blood pressure reading consists of a higher number over a lower number, such as 110/72. The higher number (systolic) is the pressure inside your arteries when your heart pumps. The lower number (diastolic) is the pressure inside your arteries when your heart relaxes. Ideally you want your blood pressure below 120/80. Hypertension forces your heart to work harder to pump blood. Your arteries may become narrow or stiff. Having hypertension puts you at risk for heart disease, stroke, and other problems.  RISK FACTORS Some risk factors for high blood pressure are controllable. Others are not.  Risk factors you cannot control include:   Race. You may be at higher risk if you are African American.  Age. Risk increases with age.  Gender. Men are at higher risk than women before age 48 years. After age 36, women are at higher risk than men. Risk factors you can control  include:  Not getting enough exercise or physical activity.  Being overweight.  Getting too much fat, sugar, calories, or salt in your diet.  Drinking too much alcohol. SIGNS AND SYMPTOMS Hypertension does not usually cause signs or symptoms. Extremely high blood pressure (hypertensive crisis) may cause headache, anxiety, shortness of breath, and nosebleed. DIAGNOSIS  To check if you have hypertension, your health care provider will measure your blood pressure while you are seated, with your arm held at the level of your heart. It should be measured at least twice using the same arm. Certain conditions can cause a difference in blood pressure between your right and left arms. A blood pressure reading that is higher than normal on one occasion does not mean that you need treatment. If one blood pressure reading is high, ask your health care provider about having it checked again. TREATMENT  Treating high blood pressure includes making lifestyle changes and possibly taking medicine. Living a healthy lifestyle can help lower high blood pressure. You may need to change some of your habits. Lifestyle changes may include:  Following the DASH diet. This diet is high in fruits, vegetables, and whole grains. It is low in salt, red meat, and added sugars.  Getting at least 2 hours of brisk physical activity every week.  Losing weight if necessary.  Not smoking.  Limiting alcoholic beverages.  Learning ways to reduce stress. If lifestyle changes are not enough to get your blood pressure  under control, your health care provider may prescribe medicine. You may need to take more than one. Work closely with your health care provider to understand the risks and benefits. HOME CARE INSTRUCTIONS  Have your blood pressure rechecked as directed by your health care provider.   Take medicines only as directed by your health care provider. Follow the directions carefully. Blood pressure medicines must  be taken as prescribed. The medicine does not work as well when you skip doses. Skipping doses also puts you at risk for problems.   Do not smoke.   Monitor your blood pressure at home as directed by your health care provider. SEEK MEDICAL CARE IF:   You think you are having a reaction to medicines taken.  You have recurrent headaches or feel dizzy.  You have swelling in your ankles.  You have trouble with your vision. SEEK IMMEDIATE MEDICAL CARE IF:  You develop a severe headache or confusion.  You have unusual weakness, numbness, or feel faint.  You have severe chest or abdominal pain.  You vomit repeatedly.  You have trouble breathing. MAKE SURE YOU:   Understand these instructions.  Will watch your condition.  Will get help right away if you are not doing well or get worse. Document Released: 05/21/2005 Document Revised: 10/05/2013 Document Reviewed: 03/13/2013 [facility] Patient Information 2015 [place], [place]. This information is not intended to replace advice given to you by your health care provider. Make sure you discuss any questions you have with your health care provider.

## 2014-07-14 NOTE — Progress Notes (Addendum)
Pre visit review using our clinic review tool, if applicable. No additional management support is needed unless otherwise documented below in the visit note.  Chief Complaint  Patient presents with  . Medicare Wellness    HPI: Patient  Brian Rodriguez  69 y.o. comes in today for [place] visit   No new sx  Still working  .   Checking bp readings  At home   1-2 x per week.      In past thinks was in 140 range but not checked recently  Active at work 14000 steps per day on work days                    LIPIDmed no se of med                                                                       Health Maintenance  Topic Date Due  . ZOSTAVAX  10/05/2005  . COLONOSCOPY  06/06/2013  . INFLUENZA VACCINE  01/03/2015  . TETANUS/TDAP  04/26/2020  . PNA vac Low Risk Adult  Completed   Health Maintenance Review LIFESTYLE:  Exercise:  See above active in job  Tobacco/ETS: 1ppd  Not stopping  Denies sob  Alcohol: per day 1-2 max  Less than 14 per week. Sugar beverages: Sleep: 6.5  - 7 hours  Drug use: no  Colonoscopy: due.  Medicare screening hx scanned in  ROS:  GEN/ HEENT: No fever, significant weight changes sweats headaches vision problems hearing changes, CV/ PULM; No chest pain shortness of breath cough, syncope,edema  change in exercise tolerance. GI /GU: No adominal pain, vomiting, change in bowel habits. No blood in the stool. No significant GU symptoms. SKIN/HEME: ,no acute skin rashes suspicious lesions or bleeding. No lymphadenopathy, nodules, masses.  NEURO/ PSYCH:  No neurologic signs such as weakness numbness. No depression anxiety. IMM/ Allergy: No unusual infections.  Allergy .   REST of 12 system review negative except as per HPI   Past Medical History  Diagnosis Date  . Hyperlipidemia   . Headache(784.0)   . ED (erectile dysfunction)   . Syncope   . Osteopenia   . H/O echocardiogram 2/10    ef 50 -60 mildly calcific aortic valve    No past  surgical history on file.  Family History  Problem Relation Age of Onset  . Cancer Father     Prostate  . Cancer Brother     Prostate  . Parkinsonism Mother     deceased 40s     History   Social History  . Marital Status: Married    Spouse Name: April Seiter  . Number of Children: 4  . Years of Education: N/A   Social History Main Topics  . Smoking status: Current Every Day Smoker  . Smokeless tobacco: Not on file  . Alcohol Use: Yes  . Drug Use: Not on file  . Sexual Activity: Not on file   Other Topics Concern  . 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 sildenafil (VIAGRA) 50 MG tablet Take 1 tablet (50 mg total) by mouth as needed for erectile dysfunction.  . [DISCONTINUED] lovastatin (MEVACOR) 40 MG tablet TAKE 1 BY MOUTH AT BEDTIME  . lisinopril (PRINIVIL,ZESTRIL) 5 MG tablet Take 1 tablet (5 mg total) by mouth daily. For hypertension    EXAM:  BP 152/72 mmHg  Temp(Src) 97.8 F (36.6 C) (Oral)  Ht 5' 3.5" (1.613 m)  Wt 141 lb 4.8 oz (64.093 kg)  BMI 24.63 kg/m2  Body mass index is 24.63 kg/(m^2).  Physical Exam: Vital signs reviewed JKD:TOIZ is a well-developed well-nourished alert cooperative    who appearsr stated age in no acute distress.  HEENT: normocephalic atraumatic , Eyes: PERRL EOM's full, conjunctiva clear, Nares: paten,t no deformity discharge or tenderness., Ears: no deformity EAC's clear TMs with normal landmarks. Mouth: clear OP, no lesions, edema.  Moist mucous membranes. Dentition in adequate repair. NECK: supple without masses, thyromegaly or bruits. CHEST/PULM:  Clear to auscultation and percussion breath sounds equal no wheeze , rales or rhonchi. No chest wall deformities or tenderness. CV: PMI is nondisplaced, S1 S2 no gallops, murmurs, rubs. Peripheral  pulses are full without delay.No JVD .  ABDOMEN: Bowel sounds normal nontender  No guard or rebound, no hepato splenomegal no CVA tenderness.  No hernia. Extremtities:  No clubbing cyanosis or edema, no acute joint swelling or redness no focal atrophy NEURO:  Oriented x3, cranial nerves 3-12 appear to be intact, no obvious focal weakness,gait within normal limits no abnormal reflexes or asymmetrical SKIN: No acute rashes normal turgor, color, no bruising or petechiae. PSYCH: Oriented, good eye contact, no obvious depression anxiety, cognition and judgment appear normal. LN: no cervical axillary inguinal adenopathy DRE 1 = [prostate no nodules noted   Lab Results  Component Value Date   WBC 7.8 07/01/2014   HGB 15.2 07/01/2014   HCT 45.0 07/01/2014   PLT 349.0 07/01/2014   GLUCOSE 91 07/01/2014   CHOL 160 07/01/2014   TRIG 112.0 07/01/2014   HDL 53.90 07/01/2014   LDLDIRECT 152.3 04/07/2007   LDLCALC 84 07/01/2014   ALT 28 07/01/2014   AST 25 07/01/2014   NA 142 07/01/2014   K 4.7 07/01/2014   CL 106 07/01/2014   CREATININE 0.94 07/01/2014   BUN 15 07/01/2014   CO2 30 07/01/2014   TSH 0.97 07/01/2014   PSA 2.90 07/01/2014    ASSESSMENT AND PLAN:  Discussed the following assessment and plan:  Visit for preventive health examination  Tobacco use disorder - counseled no t interestd is topping at this time  Family hx of prostate cancer  Hyperlipidemia  Need for 23-polyvalent pneumococcal polysaccharide vaccine - Plan: Pneumococcal polysaccharide vaccine 23-valent greater than or equal to 2yo subcutaneous/IM  Colon cancer screening - Plan: Ambulatory referral to Gastroenterology  Essential hypertension - probabl  document with home readings and add lsiniopril if elevaetd   sign up for mychart to send in readings  or concerns and plan fu if up orcall in 3 months  Patient Care Team: Burnis Medin, MD as PCP - General Patient Instructions  Take blood pressure readings  twice a day for about 10 days and then periodically .To ensure below 140/90   .Send in readings    . If not below 140/90 we should begin medication.  And then FU with readings in 2-3 months .       lifestyle  intervention healthy eating and exercise . Healthy lifestyle includes : At least 150 minutes of exercise weeks  , weight at healthy levels, which is usually   BMI 19-25. Avoid trans fats and processed foods;  Increase fresh fruits and veges to 5 servings per day. And avoid sweet beverages including tea and juice. Mediterranean diet with olive oil and nuts have been noted to be heart and brain healthy . Avoid tobacco products . Limit  alcohol to  7 per week for women and 14 servings for men.  Get adequate sleep . Wear seat belts . Don't text and drive .     Hypertension Hypertension, commonly called high blood pressure, is when the force of blood pumping through your arteries is too strong. Your arteries are the blood vessels that carry blood from your heart throughout your body. A blood pressure reading consists of a higher number over a lower number, such as 110/72. The higher number (systolic) is the pressure inside your arteries when your heart pumps. The lower number (diastolic) is the pressure inside your arteries when your heart relaxes. Ideally you want your blood pressure below 120/80. Hypertension forces your heart to work harder to pump blood. Your arteries may become narrow or stiff. Having hypertension puts you at risk for heart disease, stroke, and other problems.  RISK FACTORS Some risk factors for high blood pressure are controllable. Others are not.  Risk factors you cannot control include:   Race. You may be at higher risk if you are African American.  Age. Risk increases with age.  Gender. Men are at higher risk than women before age 44 years. After age 73, women are at higher risk than men. Risk factors you can control include:  Not getting enough exercise or  physical activity.  Being overweight.  Getting too much fat, sugar, calories, or salt in your diet.  Drinking too much alcohol. SIGNS AND SYMPTOMS Hypertension does not usually cause signs or symptoms. Extremely high blood pressure (hypertensive crisis) may cause headache, anxiety, shortness of breath, and nosebleed. DIAGNOSIS  To check if you have hypertension, your health care provider will measure your blood pressure while you are seated, with your arm held at the level of your heart. It should be measured at least twice using the same arm. Certain conditions can cause a difference in blood pressure between your right and left arms. A blood pressure reading that is higher than normal on one occasion does not mean that you need treatment. If one blood pressure reading is high, ask your health care provider about having it checked again. TREATMENT  Treating high blood pressure includes making lifestyle changes and possibly taking medicine. Living a healthy lifestyle can help lower high blood pressure. You may need to change some of your habits. Lifestyle changes may include:  Following the DASH diet. This diet is high in fruits, vegetables, and whole grains. It is low in salt, red meat, and added sugars.  Getting at least 2 hours of brisk physical activity every week.  Losing weight if necessary.  Not smoking.  Limiting alcoholic beverages.  Learning ways to reduce stress. If lifestyle changes are not enough to get your blood pressure under control, your health care provider may prescribe medicine. You may need to take more than one. Work closely with your health care provider to understand the risks and benefits. HOME CARE INSTRUCTIONS  Have your blood pressure rechecked as directed by your health care provider.   Take medicines only as  directed by your health care provider. Follow the directions carefully. Blood pressure medicines must be taken as prescribed. The medicine does  not work as well when you skip doses. Skipping doses also puts you at risk for problems.   Do not smoke.   Monitor your blood pressure at home as directed by your health care provider. SEEK MEDICAL CARE IF:   You think you are having a reaction to medicines taken.  You have recurrent headaches or feel dizzy.  You have swelling in your ankles.  You have trouble with your vision. SEEK IMMEDIATE MEDICAL CARE IF:  You develop a severe headache or confusion.  You have unusual weakness, numbness, or feel faint.  You have severe chest or abdominal pain.  You vomit repeatedly.  You have trouble breathing. MAKE SURE YOU:   Understand these instructions.  Will watch your condition.  Will get help right away if you are not doing well or get worse. Document Released: 05/21/2005 Document Revised: 10/05/2013 Document Reviewed: 03/13/2013 [facility] Patient Information 2015 [place], [place]. This information is not intended to replace advice given to you by your health care provider. Make sure you discuss any questions you have with your health care provider.     Standley Brooking. Majorie Santee M.D.   Patient sends in 10 days of blood pressure readings twice a day they average below 140 mostly 120s 130s and occasional 140. Sent to scanned blood pressure is controlled.

## 2014-07-15 ENCOUNTER — Other Ambulatory Visit: Payer: Self-pay | Admitting: Internal Medicine

## 2014-07-15 ENCOUNTER — Telehealth: Payer: Self-pay | Admitting: Internal Medicine

## 2014-07-15 NOTE — Telephone Encounter (Signed)
emmi emailed °

## 2014-07-16 MED ORDER — LOVASTATIN 40 MG PO TABS: 40.0000 mg | ORAL_TABLET | Freq: Every day | ORAL | Status: DC

## 2014-08-19 ENCOUNTER — Encounter: Payer: Self-pay | Admitting: Internal Medicine

## 2014-10-15 ENCOUNTER — Ambulatory Visit: Payer: 59 | Admitting: Internal Medicine

## 2014-10-21 ENCOUNTER — Ambulatory Visit: Payer: 59 | Admitting: Internal Medicine

## 2015-04-21 ENCOUNTER — Telehealth: Payer: Self-pay | Admitting: Internal Medicine

## 2015-04-21 NOTE — Telephone Encounter (Signed)
Pt is due for medicare wellness in Feb 2017.  Had a lipid and PSA in Jan 2016 (normal).  Please schedule for medicare wellness.  Do not think insurance will pay again for PSA and lipid until due.

## 2015-04-21 NOTE — Telephone Encounter (Signed)
Pt has been sch for feb 2017 °

## 2015-04-21 NOTE — Telephone Encounter (Signed)
Pt would like to have lipid and psa check due to family history on cancer. Can I sch?

## 2015-07-18 ENCOUNTER — Other Ambulatory Visit: Payer: Self-pay | Admitting: Internal Medicine

## 2015-07-18 NOTE — Telephone Encounter (Signed)
Sent to the pharmacy by e-scribe.  Has upcoming cpx on 07/27/15

## 2015-07-19 ENCOUNTER — Other Ambulatory Visit (INDEPENDENT_AMBULATORY_CARE_PROVIDER_SITE_OTHER): Payer: 59

## 2015-07-19 DIAGNOSIS — Z0000 Encounter for general adult medical examination without abnormal findings: Secondary | ICD-10-CM

## 2015-07-19 LAB — HEPATIC FUNCTION PANEL
ALT: 28 U/L (ref 0–53)
AST: 25 U/L (ref 0–37)
Albumin: 4.3 g/dL (ref 3.5–5.2)
Alkaline Phosphatase: 59 U/L (ref 39–117)
Bilirubin, Direct: 0.2 mg/dL (ref 0.0–0.3)
Total Bilirubin: 0.9 mg/dL (ref 0.2–1.2)
Total Protein: 6.8 g/dL (ref 6.0–8.3)

## 2015-07-19 LAB — BASIC METABOLIC PANEL
BUN: 18 mg/dL (ref 6–23)
CO2: 30 mEq/L (ref 19–32)
Calcium: 9.6 mg/dL (ref 8.4–10.5)
Chloride: 105 mEq/L (ref 96–112)
Creatinine, Ser: 1.08 mg/dL (ref 0.40–1.50)
GFR: 71.89 mL/min (ref >60.00)
Glucose, Bld: 91 mg/dL (ref 70–99)
Potassium: 4.2 mEq/L (ref 3.5–5.1)
Sodium: 142 mEq/L (ref 135–145)

## 2015-07-19 LAB — CBC WITH DIFFERENTIAL/PLATELET
Basophils Absolute: 0 10*3/uL (ref 0.0–0.1)
Basophils Relative: 0.6 % (ref 0.0–3.0)
Eosinophils Absolute: 0.2 10*3/uL (ref 0.0–0.7)
Eosinophils Relative: 2.2 % (ref 0.0–5.0)
HCT: 47.6 % (ref 39.0–52.0)
Hemoglobin: 16 g/dL (ref 13.0–17.0)
Lymphocytes Relative: 26.9 % (ref 12.0–46.0)
Lymphs Abs: 2.4 10*3/uL (ref 0.7–4.0)
MCHC: 33.7 g/dL (ref 30.0–36.0)
MCV: 93 fl (ref 78.0–100.0)
Monocytes Absolute: 0.8 10*3/uL (ref 0.1–1.0)
Monocytes Relative: 9.5 % (ref 3.0–12.0)
Neutro Abs: 5.4 10*3/uL (ref 1.4–7.7)
Neutrophils Relative %: 60.8 % (ref 43.0–77.0)
Platelets: 351 10*3/uL (ref 150.0–400.0)
RBC: 5.11 Mil/uL (ref 4.22–5.81)
RDW: 14.4 % (ref 11.5–15.5)
WBC: 8.8 10*3/uL (ref 4.0–10.5)

## 2015-07-19 LAB — TSH: TSH: 1.3 u[IU]/mL (ref 0.35–4.50)

## 2015-07-19 LAB — LIPID PANEL
Cholesterol: 151 mg/dL (ref 0–200)
HDL: 51.3 mg/dL (ref >39.00)
LDL Cholesterol: 80 mg/dL (ref 0–99)
NonHDL: 99.31
Total CHOL/HDL Ratio: 3
Triglycerides: 99 mg/dL (ref 0.0–149.0)
VLDL: 19.8 mg/dL (ref 0.0–40.0)

## 2015-07-19 LAB — PSA: PSA: 5.36 ng/mL — ABNORMAL HIGH (ref 0.10–4.00)

## 2015-07-27 ENCOUNTER — Encounter: Payer: Self-pay | Admitting: Internal Medicine

## 2015-07-27 ENCOUNTER — Ambulatory Visit (INDEPENDENT_AMBULATORY_CARE_PROVIDER_SITE_OTHER): Payer: 59 | Admitting: Internal Medicine

## 2015-07-27 VITALS — BP 154/80 | Temp 97.8°F | Ht 63.25 in | Wt 144.3 lb

## 2015-07-27 DIAGNOSIS — R972 Elevated prostate specific antigen [PSA]: Secondary | ICD-10-CM

## 2015-07-27 DIAGNOSIS — Z8042 Family history of malignant neoplasm of prostate: Secondary | ICD-10-CM | POA: Diagnosis not present

## 2015-07-27 DIAGNOSIS — E785 Hyperlipidemia, unspecified: Secondary | ICD-10-CM | POA: Diagnosis not present

## 2015-07-27 DIAGNOSIS — I10 Essential (primary) hypertension: Secondary | ICD-10-CM | POA: Diagnosis not present

## 2015-07-27 DIAGNOSIS — Z0000 Encounter for general adult medical examination without abnormal findings: Secondary | ICD-10-CM | POA: Diagnosis not present

## 2015-07-27 DIAGNOSIS — Z1211 Encounter for screening for malignant neoplasm of colon: Secondary | ICD-10-CM | POA: Diagnosis not present

## 2015-07-27 DIAGNOSIS — F17200 Nicotine dependence, unspecified, uncomplicated: Secondary | ICD-10-CM

## 2015-07-27 MED ORDER — LISINOPRIL 5 MG PO TABS: 5.0000 mg | ORAL_TABLET | Freq: Every day | ORAL | Status: DC

## 2015-07-27 NOTE — Patient Instructions (Addendum)
bgin low dose of medication for at least 2-3 months to see if blood pressure is at goal below 140/90    If controls bp and feel fine continue on medication .  Stopping tobacco  Is healthiest on your heart and blood vessels.  Will be contacted about referral for   Elevated psa  Stool test  For colon cancer screening .    Health Maintenance, Male A healthy lifestyle and preventative care can promote health and wellness.  Maintain regular health, dental, and eye exams.  Eat a healthy diet. Foods like vegetables, fruits, whole grains, low-fat dairy products, and lean protein foods contain the nutrients you need and are low in calories. Decrease your intake of foods high in solid fats, added sugars, and salt. Get information about a proper diet from your health care provider, if necessary.  Regular physical exercise is one of the most important things you can do for your health. Most adults should get at least 150 minutes of moderate-intensity exercise (any activity that increases your heart rate and causes you to sweat) each week. In addition, most adults need muscle-strengthening exercises on 2 or more days a week.   Maintain a healthy weight. The body mass index (BMI) is a screening tool to identify possible weight problems. It provides an estimate of body fat based on height and weight. Your health care provider can find your BMI and can help you achieve or maintain a healthy weight. For males 20 years and older:  A BMI below 18.5 is considered underweight.  A BMI of 18.5 to 24.9 is normal.  A BMI of 25 to 29.9 is considered overweight.  A BMI of 30 and above is considered obese.  Maintain normal blood lipids and cholesterol by exercising and minimizing your intake of saturated fat. Eat a balanced diet with plenty of fruits and vegetables. Blood tests for lipids and cholesterol should begin at age 96 and be repeated every 5 years. If your lipid or cholesterol levels are high, you are over  age 32, or you are at high risk for heart disease, you may need your cholesterol levels checked more frequently.Ongoing high lipid and cholesterol levels should be treated with medicines if diet and exercise are not working.  If you smoke, find out from your health care provider how to quit. If you do not use tobacco, do not start.  Lung cancer screening is recommended for adults aged 69-80 years who are at high risk for developing lung cancer because of a history of smoking. A yearly low-dose CT scan of the lungs is recommended for people who have at least a 30-pack-year history of smoking and are current smokers or have quit within the past 15 years. A pack year of smoking is smoking an average of 1 pack of cigarettes a day for 1 year (for example, a 30-pack-year history of smoking could mean smoking 1 pack a day for 30 years or 2 packs a day for 15 years). Yearly screening should continue until the smoker has stopped smoking for at least 15 years. Yearly screening should be stopped for people who develop a health problem that would prevent them from having lung cancer treatment.  If you choose to drink alcohol, do not have more than 2 drinks per day. One drink is considered to be 12 oz (360 mL) of beer, 5 oz (150 mL) of wine, or 1.5 oz (45 mL) of liquor.  Avoid the use of street drugs. Do not share needles with  anyone. Ask for help if you need support or instructions about stopping the use of drugs.  High blood pressure causes heart disease and increases the risk of stroke. High blood pressure is more likely to develop in:  People who have blood pressure in the end of the normal range (100-139/85-89 mm Hg).  People who are overweight or obese.  People who are African American.  If you are 4-13 years of age, have your blood pressure checked every 3-5 years. If you are 67 years of age or older, have your blood pressure checked every year. You should have your blood pressure measured twice--once  when you are at a hospital or clinic, and once when you are not at a hospital or clinic. Record the average of the two measurements. To check your blood pressure when you are not at a hospital or clinic, you can use:  An automated blood pressure machine at a pharmacy.  A home blood pressure monitor.  If you are 34-8 years old, ask your health care provider if you should take aspirin to prevent heart disease.  Diabetes screening involves taking a blood sample to check your fasting blood sugar level. This should be done once every 3 years after age 29 if you are at a normal weight and without risk factors for diabetes. Testing should be considered at a younger age or be carried out more frequently if you are overweight and have at least 1 risk factor for diabetes.  Colorectal cancer can be detected and often prevented. Most routine colorectal cancer screening begins at the age of 58 and continues through age 13. However, your health care provider may recommend screening at an earlier age if you have risk factors for colon cancer. On a yearly basis, your health care provider may provide home test kits to check for hidden blood in the stool. A small camera at the end of a tube may be used to directly examine the colon (sigmoidoscopy or colonoscopy) to detect the earliest forms of colorectal cancer. Talk to your health care provider about this at age 68 when routine screening begins. A direct exam of the colon should be repeated every 5-10 years through age 87, unless early forms of precancerous polyps or small growths are found.  People who are at an increased risk for hepatitis B should be screened for this virus. You are considered at high risk for hepatitis B if:  You were born in a country where hepatitis B occurs often. Talk with your health care provider about which countries are considered high risk.  Your parents were born in a high-risk country and you have not received a shot to protect  against hepatitis B (hepatitis B vaccine).  You have HIV or AIDS.  You use needles to inject street drugs.  You live with, or have sex with, someone who has hepatitis B.  You are a man who has sex with other men (MSM).  You get hemodialysis treatment.  You take certain medicines for conditions like cancer, organ transplantation, and autoimmune conditions.  Hepatitis C blood testing is recommended for all people born from [birth year] through 1965 and any individual with known risk factors for hepatitis C.  Healthy men should no longer receive prostate-specific antigen (PSA) blood tests as part of routine cancer screening. Talk to your health care provider about prostate cancer screening.  Testicular cancer screening is not recommended for adolescents or adult males who have no symptoms. Screening includes self-exam, a health care provider exam,  and other screening tests. Consult with your health care provider about any symptoms you have or any concerns you have about testicular cancer.  Practice safe sex. Use condoms and avoid high-risk sexual practices to reduce the spread of sexually transmitted infections (STIs).  You should be screened for STIs, including gonorrhea and chlamydia if:  You are sexually active and are younger than 24 years.  You are older than 24 years, and your health care provider tells you that you are at risk for this type of infection.  Your sexual activity has changed since you were last screened, and you are at an increased risk for chlamydia or gonorrhea. Ask your health care provider if you are at risk.  If you are at risk of being infected with HIV, it is recommended that you take a prescription medicine daily to prevent HIV infection. This is called pre-exposure prophylaxis (PrEP). You are considered at risk if:  You are a man who has sex with other men (MSM).  You are a heterosexual man who is sexually active with multiple partners.  You take drugs by  injection.  You are sexually active with a partner who has HIV.  Talk with your health care provider about whether you are at high risk of being infected with HIV. If you choose to begin PrEP, you should first be tested for HIV. You should then be tested every 3 months for as long as you are taking PrEP.  Use sunscreen. Apply sunscreen liberally and repeatedly throughout the day. You should seek shade when your shadow is shorter than you. Protect yourself by wearing long sleeves, pants, a wide-brimmed hat, and sunglasses year round whenever you are outdoors.  Tell your health care provider of new moles or changes in moles, especially if there is a change in shape or color. Also, tell your health care provider if a mole is larger than the size of a pencil eraser.  A one-time screening for abdominal aortic aneurysm (AAA) and surgical repair of large AAAs by ultrasound is recommended for men aged 90-75 years who are current or former smokers.  Stay current with your vaccines (immunizations).   This information is not intended to replace advice given to you by your health care provider. Make sure you discuss any questions you have with your health care provider.   Document Released: 11/17/2007 Document Revised: 06/11/2014 Document Reviewed: 10/16/2010 Elsevier Interactive Patient Education 2016 [place] Antigen Test WHY AM I HAVING THIS TEST? The prostate-specific antigen (PSA) test is performed to determine how much PSA you have in your blood. PSA is a type of protein that is normally present in the prostate gland. Certain conditions can cause PSA blood levels to increase, such as:  Infection in the prostate (prostatitis).  Enlargement of the prostate (hypertrophy).  Prostate cancer. Because PSA levels increase greatly from prostate cancer, this test can be used to confirm a diagnosis of prostate cancer. It may also be used to monitor treatment for prostate cancer and  to watch for a return of prostate cancer after treatment has finished.  This test has a very high false-positive rate. Therefore, routine PSA screening for all men is no longer recommended. A false-positive result is incorrect because it indicates a condition or finding is present when it is not.  WHAT KIND OF SAMPLE IS TAKEN?  A blood sample is required for this test. It is usually collected by inserting a needle into a vein or by sticking a finger with a  small needle. HOW DO I PREPARE FOR THE TEST? There is no preparation required for this test. However, there are factors that can affect the results of a PSA test. To get the most accurate results:  Avoid having a rectal exam within several hours before having your blood drawn for this test.   Avoid having any procedures performed on the prostate gland within 6 weeks of having this test.   Avoid ejaculating within 24 hours of having this test.   Tell your health care provider if you had a recent urinary tract infection (UTI).  Tell your health care provider if you are taking medicines to assist with hair growth, such as finasteride.  Tell your health care provider if you have been exposed to a medicine called diethylstilbestrol. Let your health care provider know if any of these factors apply to you. You may be asked to reschedule the test. WHAT ARE THE REFERENCE RANGES? Reference ranges are established after testing a large group of people. Reference ranges may vary among different people, labs, and hospitals. It is your responsibility to obtain your test results. Ask the lab or department performing the test when and how you will get your results.  Low: 0-2.5 ng/mL.  Slightly to moderately elevated: 2.6-10.0 ng/mL.  Moderately elevated: 10.0-19.9 ng/mL.  Significantly elevated: 20 ng/mL or greater. WHAT DO THE RESULTS MEAN? PSA test results greater than 4 ng/mL are found in the majority of men with prostate cancer. If your test  result is above this level, this can indicate an increased risk for prostate cancer. Increased PSA levels can also indicate other health conditions. Talk with your health care provider to discuss your results, treatment options, and if necessary, the need for more tests. Talk with your health care provider if you have any questions about your results.   This information is not intended to replace advice given to you by your health care provider. Make sure you discuss any questions you have with your health care provider.   Document Released: 06/23/2004 Document Revised: 02/09/2015 Document Reviewed: 10/14/2013 Elsevier Interactive Patient Education [employer].

## 2015-07-27 NOTE — Progress Notes (Signed)
Pre visit review using our clinic review tool, if applicable. No additional management support is needed unless otherwise documented below in the visit note.  Chief Complaint  Patient presents with  . Annual Exam    HPI: Patient  Brian Rodriguez  70 y.o. comes in today for [place] visit  And labs  meds statin med no se  Still tobacco Still working  Never took med for bp  Hesitant to do this   Checks bp  Out of office and in 140 and mid 140 range  Gets stressed at work and might go up them   Health Maintenance  Topic Date Due  . COLONOSCOPY  06/06/2013  . ZOSTAVAX  07/26/2016 (Originally 10/05/2005)  . Hepatitis C Screening  07/26/2016 (Originally [birth year])  . INFLUENZA VACCINE  01/03/2016  . TETANUS/TDAP  04/26/2020  . PNA vac Low Risk Adult  Completed   Health Maintenance Review LIFESTYLE:  Exercise:   Work  Tobacco/ETS:  Dec under ppd.    Alcohol:   1 per day Sugar beverages: Sleep:  6-7  Drug use: no 12  Hours  ... Shifts   ROS:  GEN/ HEENT: No fever, significant weight changes sweats headaches vision problems hearing changes, CV/ PULM; No chest pain shortness of breath cough, syncope,edema  change in exercise tolerance. GI /GU: No adominal pain, vomiting, change in bowel habits. No blood in the stool. No significant GU symptoms. SKIN/HEME: ,no acute skin rashes suspicious lesions or bleeding. No lymphadenopathy, nodules, masses.  NEURO/ PSYCH:  No neurologic signs such as weakness numbness. No depression anxiety. IMM/ Allergy: No unusual infections.  Allergy .   REST of 12 system review negative except as per HPI   Past Medical History  Diagnosis Date  . Hyperlipidemia   . Headache(784.0)   . ED (erectile dysfunction)   . Syncope   . Osteopenia   . H/O echocardiogram 2/10    ef 50 -60 mildly calcific aortic valve    History reviewed. No pertinent past surgical history.  Family History  Problem Relation Age of Onset  . Cancer Father    Prostate  . Cancer Brother     Prostate  . Parkinsonism Mother     deceased 52s   . Cancer Sister     abdominal? gi cancer?   sister passed vrom some abdominal cancer  >? Stomach  Fall 15   Social History   Social History  . Marital Status: Married    Spouse Name: April Postlewait  . Number of Children: 4  . Years of Education: N/A   Social History Main Topics  . Smoking status: Current Every Day Smoker  . Smokeless tobacco: None  . Alcohol Use: Yes  . Drug Use: None  . Sexual Activity: Not Asked   Other Topics Concern  . None   Social History Narrative   HH of 3  Now for 10 years    Married  Divorced x 2    Works [employer]  86- 48 per week 12 hour shifts   1ppd tobacco    1 dog    Outpatient Prescriptions Prior to Visit  Medication Sig Dispense Refill  . lovastatin (MEVACOR) 40 MG tablet TAKE 1 TABLET (40 MG TOTAL) BY MOUTH AT BEDTIME. 90 tablet 0  . sildenafil (VIAGRA) 50 MG tablet Take 1 tablet (50 mg total) by mouth as needed for erectile dysfunction. 18 tablet 3  . lisinopril (PRINIVIL,ZESTRIL) 5 MG tablet Take 1 tablet (5 mg total) by  mouth daily. For hypertension (Patient not taking: Reported on 07/27/2015) 90 tablet 1   No facility-administered medications prior to visit.     EXAM:  BP 154/80 mmHg  Temp(Src) 97.8 F (36.6 C) (Oral)  Ht 5' 3.25" (1.607 m)  Wt 144 lb 4.8 oz (65.454 kg)  BMI 25.35 kg/m2  Body mass index is 25.35 kg/(m^2).  Physical Exam: Vital signs reviewed [id number] is a well-developed well-nourished alert cooperative    who appearsr stated age in no acute distress.  HEENT: normocephalic atraumatic , Eyes: PERRL EOM's full, conjunctiva clear, Nares: paten,t no deformity discharge or tenderness., Ears: no deformity EAC's clear TMs with normal landmarks. Mouth: clear OP, no lesions, edema.  Moist mucous membranes. Dentition in adequate repair. NECK: supple without masses, thyromegaly or bruits. CHEST/PULM:  Clear to auscultation and  percussion breath sounds equal no wheeze , rales or rhonchi. No chest wall deformities or tenderness. CV: PMI is nondisplaced, S1 S2 no gallops, murmurs, rubs. Peripheral pulses are full without delay.No JVD .  ABDOMEN: Bowel sounds normal nontender  No guard or rebound, no hepato splenomegal no CVA tenderness.   Extremtities:  No clubbing cyanosis or edema, no acute joint swelling or redness no focal atrophy NEURO:  Oriented x3, cranial nerves 3-12 appear to be intact, no obvious focal weakness,gait within normal limits no abnormal reflexes or asymmetrical SKIN: No acute rashes normal turgor, color, sun changes  Ant shins  Fine pink papular rash   No petechia   Bruise left forearm.  PSYCH: Oriented, good eye contact, no obvious depression anxiety, cognition and judgment appear normal. LN: no cervical axillary inguinal adenopathy Rectal deferred this time  As referring to urology.  Lab Results  Component Value Date   WBC 8.8 07/19/2015   HGB 16.0 07/19/2015   HCT 47.6 07/19/2015   PLT 351.0 07/19/2015   GLUCOSE 91 07/19/2015   CHOL 151 07/19/2015   TRIG 99.0 07/19/2015   HDL 51.30 07/19/2015   LDLDIRECT 152.3 04/07/2007   LDLCALC 80 07/19/2015   ALT 28 07/19/2015   AST 25 07/19/2015   NA 142 07/19/2015   K 4.2 07/19/2015   CL 105 07/19/2015   CREATININE 1.08 07/19/2015   BUN 18 07/19/2015   CO2 30 07/19/2015   TSH 1.30 07/19/2015   PSA 5.36* 07/19/2015    ASSESSMENT AND PLAN:  Discussed the following assessment and plan:  Visit for preventive health examination  Essential hypertension - goal should be belwo 140/90 will try acei this time and check readings rov in 3-4 months  Hyperlipidemia - no change  Family hx of prostate cancer - brother and father  - Plan: Ambulatory referral to Urology  Elevated PSA measurement - below 1 5 years ago rising over past 1-2 years now elevaetd range  - Plan: Ambulatory referral to Urology  Colon cancer screening - stool test  had inital  colon ? 10 year recall  - Plan: Fecal occult blood, imunochemical  Tobacco use disorder - not ready to quit  Patient Care Team: Burnis Medin, MD as PCP - General Patient Instructions  bgin low dose of medication for at least 2-3 months to see if blood pressure is at goal below 140/90    If controls bp and feel fine continue on medication .  Stopping tobacco  Is healthiest on your heart and blood vessels.  Will be contacted about referral for   Elevated psa  Stool test  For colon cancer screening .    Health Maintenance,  Male A healthy lifestyle and preventative care can promote health and wellness.  Maintain regular health, dental, and eye exams.  Eat a healthy diet. Foods like vegetables, fruits, whole grains, low-fat dairy products, and lean protein foods contain the nutrients you need and are low in calories. Decrease your intake of foods high in solid fats, added sugars, and salt. Get information about a proper diet from your health care provider, if necessary.  Regular physical exercise is one of the most important things you can do for your health. Most adults should get at least 150 minutes of moderate-intensity exercise (any activity that increases your heart rate and causes you to sweat) each week. In addition, most adults need muscle-strengthening exercises on 2 or more days a week.   Maintain a healthy weight. The body mass index (BMI) is a screening tool to identify possible weight problems. It provides an estimate of body fat based on height and weight. Your health care provider can find your BMI and can help you achieve or maintain a healthy weight. For males 20 years and older:  A BMI below 18.5 is considered underweight.  A BMI of 18.5 to 24.9 is normal.  A BMI of 25 to 29.9 is considered overweight.  A BMI of 30 and above is considered obese.  Maintain normal blood lipids and cholesterol by exercising and minimizing your intake of saturated fat. Eat a balanced  diet with plenty of fruits and vegetables. Blood tests for lipids and cholesterol should begin at age 58 and be repeated every 5 years. If your lipid or cholesterol levels are high, you are over age 80, or you are at high risk for heart disease, you may need your cholesterol levels checked more frequently.Ongoing high lipid and cholesterol levels should be treated with medicines if diet and exercise are not working.  If you smoke, find out from your health care provider how to quit. If you do not use tobacco, do not start.  Lung cancer screening is recommended for adults aged 20-80 years who are at high risk for developing lung cancer because of a history of smoking. A yearly low-dose CT scan of the lungs is recommended for people who have at least a 30-pack-year history of smoking and are current smokers or have quit within the past 15 years. A pack year of smoking is smoking an average of 1 pack of cigarettes a day for 1 year (for example, a 30-pack-year history of smoking could mean smoking 1 pack a day for 30 years or 2 packs a day for 15 years). Yearly screening should continue until the smoker has stopped smoking for at least 15 years. Yearly screening should be stopped for people who develop a health problem that would prevent them from having lung cancer treatment.  If you choose to drink alcohol, do not have more than 2 drinks per day. One drink is considered to be 12 oz (360 mL) of beer, 5 oz (150 mL) of wine, or 1.5 oz (45 mL) of liquor.  Avoid the use of street drugs. Do not share needles with anyone. Ask for help if you need support or instructions about stopping the use of drugs.  High blood pressure causes heart disease and increases the risk of stroke. High blood pressure is more likely to develop in:  People who have blood pressure in the end of the normal range (100-139/85-89 mm Hg).  People who are overweight or obese.  People who are African American.  If you  are 18-39 years of  age, have your blood pressure checked every 3-5 years. If you are 53 years of age or older, have your blood pressure checked every year. You should have your blood pressure measured twice--once when you are at a hospital or clinic, and once when you are not at a hospital or clinic. Record the average of the two measurements. To check your blood pressure when you are not at a hospital or clinic, you can use:  An automated blood pressure machine at a pharmacy.  A home blood pressure monitor.  If you are 71-10 years old, ask your health care provider if you should take aspirin to prevent heart disease.  Diabetes screening involves taking a blood sample to check your fasting blood sugar level. This should be done once every 3 years after age 54 if you are at a normal weight and without risk factors for diabetes. Testing should be considered at a younger age or be carried out more frequently if you are overweight and have at least 1 risk factor for diabetes.  Colorectal cancer can be detected and often prevented. Most routine colorectal cancer screening begins at the age of 39 and continues through age 24. However, your health care provider may recommend screening at an earlier age if you have risk factors for colon cancer. On a yearly basis, your health care provider may provide home test kits to check for hidden blood in the stool. A small camera at the end of a tube may be used to directly examine the colon (sigmoidoscopy or colonoscopy) to detect the earliest forms of colorectal cancer. Talk to your health care provider about this at age 24 when routine screening begins. A direct exam of the colon should be repeated every 5-10 years through age 75, unless early forms of precancerous polyps or small growths are found.  People who are at an increased risk for hepatitis B should be screened for this virus. You are considered at high risk for hepatitis B if:  You were born in a country where hepatitis B  occurs often. Talk with your health care provider about which countries are considered high risk.  Your parents were born in a high-risk country and you have not received a shot to protect against hepatitis B (hepatitis B vaccine).  You have HIV or AIDS.  You use needles to inject street drugs.  You live with, or have sex with, someone who has hepatitis B.  You are a man who has sex with other men (MSM).  You get hemodialysis treatment.  You take certain medicines for conditions like cancer, organ transplantation, and autoimmune conditions.  Hepatitis C blood testing is recommended for all people born from [birth year] through 1965 and any individual with known risk factors for hepatitis C.  Healthy men should no longer receive prostate-specific antigen (PSA) blood tests as part of routine cancer screening. Talk to your health care provider about prostate cancer screening.  Testicular cancer screening is not recommended for adolescents or adult males who have no symptoms. Screening includes self-exam, a health care provider exam, and other screening tests. Consult with your health care provider about any symptoms you have or any concerns you have about testicular cancer.  Practice safe sex. Use condoms and avoid high-risk sexual practices to reduce the spread of sexually transmitted infections (STIs).  You should be screened for STIs, including gonorrhea and chlamydia if:  You are sexually active and are younger than 24 years.  You are older  than 24 years, and your health care provider tells you that you are at risk for this type of infection.  Your sexual activity has changed since you were last screened, and you are at an increased risk for chlamydia or gonorrhea. Ask your health care provider if you are at risk.  If you are at risk of being infected with HIV, it is recommended that you take a prescription medicine daily to prevent HIV infection. This is called pre-exposure prophylaxis  (PrEP). You are considered at risk if:  You are a man who has sex with other men (MSM).  You are a heterosexual man who is sexually active with multiple partners.  You take drugs by injection.  You are sexually active with a partner who has HIV.  Talk with your health care provider about whether you are at high risk of being infected with HIV. If you choose to begin PrEP, you should first be tested for HIV. You should then be tested every 3 months for as long as you are taking PrEP.  Use sunscreen. Apply sunscreen liberally and repeatedly throughout the day. You should seek shade when your shadow is shorter than you. Protect yourself by wearing long sleeves, pants, a wide-brimmed hat, and sunglasses year round whenever you are outdoors.  Tell your health care provider of new moles or changes in moles, especially if there is a change in shape or color. Also, tell your health care provider if a mole is larger than the size of a pencil eraser.  A one-time screening for abdominal aortic aneurysm (AAA) and surgical repair of large AAAs by ultrasound is recommended for men aged 71-75 years who are current or former smokers.  Stay current with your vaccines (immunizations).   This information is not intended to replace advice given to you by your health care provider. Make sure you discuss any questions you have with your health care provider.   Document Released: 11/17/2007 Document Revised: 06/11/2014 Document Reviewed: 10/16/2010 Elsevier Interactive Patient Education 2016 [place] Antigen Test WHY AM I HAVING THIS TEST? The prostate-specific antigen (PSA) test is performed to determine how much PSA you have in your blood. PSA is a type of protein that is normally present in the prostate gland. Certain conditions can cause PSA blood levels to increase, such as:  Infection in the prostate (prostatitis).  Enlargement of the prostate (hypertrophy).  Prostate  cancer. Because PSA levels increase greatly from prostate cancer, this test can be used to confirm a diagnosis of prostate cancer. It may also be used to monitor treatment for prostate cancer and to watch for a return of prostate cancer after treatment has finished.  This test has a very high false-positive rate. Therefore, routine PSA screening for all men is no longer recommended. A false-positive result is incorrect because it indicates a condition or finding is present when it is not.  WHAT KIND OF SAMPLE IS TAKEN?  A blood sample is required for this test. It is usually collected by inserting a needle into a vein or by sticking a finger with a small needle. HOW DO I PREPARE FOR THE TEST? There is no preparation required for this test. However, there are factors that can affect the results of a PSA test. To get the most accurate results:  Avoid having a rectal exam within several hours before having your blood drawn for this test.   Avoid having any procedures performed on the prostate gland within 6 weeks of having this  test.   Avoid ejaculating within 24 hours of having this test.   Tell your health care provider if you had a recent urinary tract infection (UTI).  Tell your health care provider if you are taking medicines to assist with hair growth, such as finasteride.  Tell your health care provider if you have been exposed to a medicine called diethylstilbestrol. Let your health care provider know if any of these factors apply to you. You may be asked to reschedule the test. WHAT ARE THE REFERENCE RANGES? Reference ranges are established after testing a large group of people. Reference ranges may vary among different people, labs, and hospitals. It is your responsibility to obtain your test results. Ask the lab or department performing the test when and how you will get your results.  Low: 0-2.5 ng/mL.  Slightly to moderately elevated: 2.6-10.0 ng/mL.  Moderately elevated:  10.0-19.9 ng/mL.  Significantly elevated: 20 ng/mL or greater. WHAT DO THE RESULTS MEAN? PSA test results greater than 4 ng/mL are found in the majority of men with prostate cancer. If your test result is above this level, this can indicate an increased risk for prostate cancer. Increased PSA levels can also indicate other health conditions. Talk with your health care provider to discuss your results, treatment options, and if necessary, the need for more tests. Talk with your health care provider if you have any questions about your results.   This information is not intended to replace advice given to you by your health care provider. Make sure you discuss any questions you have with your health care provider.   Document Released: 06/23/2004 Document Revised: 02/09/2015 Document Reviewed: 10/14/2013 Elsevier Interactive Patient Education 2016 [place] K. Arn Mcomber M.D.

## 2015-09-21 ENCOUNTER — Telehealth: Payer: Self-pay | Admitting: Internal Medicine

## 2015-09-21 MED ORDER — LOSARTAN POTASSIUM 25 MG PO TABS: 25.0000 mg | ORAL_TABLET | Freq: Every day | ORAL | Status: DC

## 2015-09-21 NOTE — Telephone Encounter (Signed)
Spoke to the pt.  Advised him to stop the lisinopril and start losartan.  Informed him may take 2-3 weeks for cough to subside and medication to kick in fully.  He will call back if needed.  Has a follow up scheduled next month.  Will keep a record of readings.

## 2015-09-21 NOTE — Telephone Encounter (Signed)
Stop the lisinopril please update reason for dc is cough  Change to losartan  25 mg 1 po qd  Disp 30 refill x 3  May take  2-3 weeks for the cough to subside  Contact us if doesn't  Get better  Update [place] with his BP readings  And then plan fu.

## 2015-09-21 NOTE — Telephone Encounter (Signed)
Pt would like you to call him concerning his lisinopril (PRINIVIL,ZESTRIL) 5 MG tablet. Pt has some questions.

## 2015-09-21 NOTE — Telephone Encounter (Signed)
Spoke to the pt.  Started taking lisinopril around the 19th-20th of last month.  Has now developed a cough.  Stated the medication is working.  Systolic mostly in the [id number].  Not sure if he can live with the cough.  Please advise.  Thanks!

## 2015-10-12 ENCOUNTER — Other Ambulatory Visit: Payer: Self-pay | Admitting: Internal Medicine

## 2015-10-12 NOTE — Telephone Encounter (Signed)
Sent to the pharmacy by e-scribe. 

## 2015-10-19 NOTE — Progress Notes (Signed)
Chief Complaint  Patient presents with  . Follow-up    BP  . Pt c/o right ear pressure x 2 months    HPI: Brian Rodriguez 70 y.o.  Fu BP:   Taking med q d   No se  And bp at home 120s  No more than 130 and 78 - 80 diastolic    Possible allergies    Ear pressure      Feels like swimmer ears.    Echos  Normal sounds   Hyperacusis ? Comes and goes no pain used otc ear drops . To have prostate bx in June  ROS: See pertinent positives and negatives per HPI.  Past Medical History  Diagnosis Date  . Hyperlipidemia   . Headache(784.0)   . ED (erectile dysfunction)   . Syncope   . Osteopenia   . H/O echocardiogram 2/10    ef 26 -54 mildly calcific aortic valve    Family History  Problem Relation Age of Onset  . Cancer Father     Prostate  . Cancer Brother     Prostate  . Parkinsonism Mother     deceased 48s   . Cancer Sister     abdominal? gi cancer?     Social History   Social History  . Marital Status: Married    Spouse Name: [name]  . Number of Children: 4  . Years of Education: N/A   Social History Main Topics  . Smoking status: Current Every Day Smoker  . Smokeless tobacco: None  . Alcohol Use: Yes  . Drug Use: None  . Sexual Activity: Not Asked   Other Topics Concern  . None   Social History Narrative   HH of 3  Now for 10 years    Married  Divorced x 2    Works [employer]  21- 48 per week 12 hour shifts   1ppd tobacco    1 dog    Outpatient Prescriptions Prior to Visit  Medication Sig Dispense Refill  . losartan (COZAAR) 25 MG tablet Take 1 tablet (25 mg total) by mouth daily. 30 tablet 2  . lovastatin (MEVACOR) 40 MG tablet TAKE 1 TABLET (40 MG TOTAL) BY MOUTH AT BEDTIME. 90 tablet 1  . sildenafil (VIAGRA) 50 MG tablet Take 1 tablet (50 mg total) by mouth as needed for erectile dysfunction. 18 tablet 3   No facility-administered medications prior to visit.     EXAM:  BP 130/82 mmHg  Pulse 71  Temp(Src) 98 F (36.7 C) (Oral)  Ht 5'  3.25" (1.607 m)  Wt 142 lb (64.411 kg)  BMI 24.94 kg/m2  SpO2 98%  Body mass index is 24.94 kg/(m^2).  GENERAL: vitals reviewed and listed above, alert, oriented, appears well hydrated and in no acute distress HEENT: atraumatic, conjunctiva  clear, no obvious abnormalities on inspection of external nose and ears  Right eac brown soft wax impacted  Left eac   OP : no lesion edema or exudate  NECK: no obvious masses on inspection palpation  No adenopathy LUNGS: clear to auscultation bilaterally, no wheezes, rales or rhonchi, good air movement CV: HRRR, no clubbing cyanosis or  peripheral edema nl cap refill  PSYCH: pleasant and cooperative, no obvious depression or anxiety BP Readings from Last 3 Encounters:  10/20/15 130/82  07/27/15 154/80  08/13/14 133/83   Wt Readings from Last 3 Encounters:  10/20/15 142 lb (64.411 kg)  07/27/15 144 lb 4.8 oz (65.454 kg)  07/14/14 141  lb 4.8 oz (64.093 kg)   Lab Results  Component Value Date   WBC 8.8 07/19/2015   HGB 16.0 07/19/2015   HCT 47.6 07/19/2015   PLT 351.0 07/19/2015   GLUCOSE 91 07/19/2015   CHOL 151 07/19/2015   TRIG 99.0 07/19/2015   HDL 51.30 07/19/2015   LDLDIRECT 152.3 04/07/2007   LDLCALC 80 07/19/2015   ALT 28 07/19/2015   AST 25 07/19/2015   NA 142 07/19/2015   K 4.2 07/19/2015   CL 105 07/19/2015   CREATININE 1.08 07/19/2015   BUN 18 07/19/2015   CO2 30 07/19/2015   TSH 1.30 07/19/2015   PSA 5.36* 07/19/2015    ASSESSMENT AND PLAN:  Discussed the following assessment and plan:  Essential hypertension - goo response to low dose arb. refill  - Plan: Basic metabolic panel  Excess wax in ear, right - Plan: Ear wax removal, Ear wax removal   To get [place] name of mail away for refills if he decides so  -Patient advised to return or notify health care team  if symptoms worsen ,persist or new concerns arise.  Patient Instructions  Will notify you  of labs when available. Continue daily medication .  Will send  in refills to [facility] away.  Yearly wellness cpx  Next  February or march      Brian Rodriguez Brian Rodriguez M.D.

## 2015-10-20 ENCOUNTER — Ambulatory Visit (INDEPENDENT_AMBULATORY_CARE_PROVIDER_SITE_OTHER): Payer: 59 | Admitting: Internal Medicine

## 2015-10-20 ENCOUNTER — Encounter: Payer: Self-pay | Admitting: Internal Medicine

## 2015-10-20 VITALS — BP 130/82 | HR 71 | Temp 98.0°F | Ht 63.25 in | Wt 142.0 lb

## 2015-10-20 DIAGNOSIS — I10 Essential (primary) hypertension: Secondary | ICD-10-CM

## 2015-10-20 DIAGNOSIS — H6121 Impacted cerumen, right ear: Secondary | ICD-10-CM

## 2015-10-20 LAB — BASIC METABOLIC PANEL
BUN: 12 mg/dL (ref 6–23)
CO2: 31 mEq/L (ref 19–32)
Calcium: 9.4 mg/dL (ref 8.4–10.5)
Chloride: 106 mEq/L (ref 96–112)
Creatinine, Ser: 0.84 mg/dL (ref 0.40–1.50)
GFR: 96 mL/min (ref >60.00)
Glucose, Bld: 95 mg/dL (ref 70–99)
Potassium: 4.1 mEq/L (ref 3.5–5.1)
Sodium: 141 mEq/L (ref 135–145)

## 2015-10-20 NOTE — Patient Instructions (Addendum)
Will notify you  of labs when available. Continue daily medication .  Will send in refills to mail away.  Yearly wellness cpx  Next  February or march

## 2015-12-11 ENCOUNTER — Other Ambulatory Visit: Payer: Self-pay | Admitting: Internal Medicine

## 2015-12-12 NOTE — Telephone Encounter (Signed)
Sent to the pharmacy by e-scribe. 

## 2015-12-28 ENCOUNTER — Encounter: Payer: Self-pay | Admitting: Radiation Oncology

## 2015-12-28 NOTE — Progress Notes (Signed)
GU Location of Tumor / Histology: prostatic adenocarcinoma  If Prostate Cancer, Gleason Score is (3 + 4) and PSA is (5.36)  Brian Rodriguez was referred by his PCP, Dr. Shanon Ace, to Dr. Tresa Moore on 08/01/15 for further evaluation of an elevated PSA.  Biopsies of prostate (if applicable) revealed:    Past/Anticipated interventions by urology, if any: biopsy, discuss about options, referral to radiation oncology for second opinion  Past/Anticipated interventions by medical oncology, if any: no  Weight changes, if any: no  Bowel/Bladder complaints, if any: nocturia x 1 and ED. Denies dysuria, hematuria, or leakage.    Nausea/Vomiting, if any: no  Pain issues, if any:  Reports intermittent left hip pain after prolonged sitting. Reports he can "normally walk it off."  SAFETY ISSUES:  Prior radiation? no  Pacemaker/ICD? no  Possible current pregnancy? no  Is the patient on methotrexate? no  Current Complaints / other details:  70 year old male. Prostate volume: 37.81 grams. Married.

## 2015-12-29 ENCOUNTER — Ambulatory Visit
Admission: RE | Admit: 2015-12-29 | Discharge: 2015-12-29 | Disposition: A | Discharge status: Home / Self Care | Payer: 59 | Source: Ambulatory Visit | Attending: Radiation Oncology | Admitting: Radiation Oncology

## 2015-12-29 ENCOUNTER — Encounter: Payer: Self-pay | Admitting: Medical Oncology

## 2015-12-29 ENCOUNTER — Encounter: Payer: Self-pay | Admitting: Radiation Oncology

## 2015-12-29 VITALS — BP 125/65 | HR 63 | Resp 16 | Ht 65.0 in | Wt 140.2 lb

## 2015-12-29 DIAGNOSIS — F17210 Nicotine dependence, cigarettes, uncomplicated: Secondary | ICD-10-CM | POA: Diagnosis not present

## 2015-12-29 DIAGNOSIS — Z8042 Family history of malignant neoplasm of prostate: Secondary | ICD-10-CM | POA: Diagnosis not present

## 2015-12-29 DIAGNOSIS — I10 Essential (primary) hypertension: Secondary | ICD-10-CM | POA: Insufficient documentation

## 2015-12-29 DIAGNOSIS — C61 Malignant neoplasm of prostate: Secondary | ICD-10-CM | POA: Diagnosis not present

## 2015-12-29 DIAGNOSIS — N529 Male erectile dysfunction, unspecified: Secondary | ICD-10-CM | POA: Diagnosis not present

## 2015-12-29 DIAGNOSIS — M8580 Other specified disorders of bone density and structure, unspecified site: Secondary | ICD-10-CM | POA: Insufficient documentation

## 2015-12-29 DIAGNOSIS — Z7982 Long term (current) use of aspirin: Secondary | ICD-10-CM | POA: Diagnosis not present

## 2015-12-29 DIAGNOSIS — Z79899 Other long term (current) drug therapy: Secondary | ICD-10-CM | POA: Insufficient documentation

## 2015-12-29 DIAGNOSIS — E785 Hyperlipidemia, unspecified: Secondary | ICD-10-CM | POA: Diagnosis not present

## 2015-12-29 HISTORY — DX: Essential (primary) hypertension: I10

## 2015-12-29 HISTORY — DX: Malignant neoplasm of prostate: C61

## 2015-12-29 NOTE — Progress Notes (Signed)
Radiation Oncology         (336) [phone] ________________________________  Initial Outpatient Consultation  Name: Brian Rodriguez MRN: [id number]  Date: 12/29/2015  DOB: 01/15/1946  [id number] KOTVAN, MD  Panosh, Standley Brooking, MD   REFERRING PHYSICIAN: Burnis Medin, MD  DIAGNOSIS: The encounter diagnosis was Malignant neoplasm of prostate [facility]).    ICD-9-CM ICD-10-CM   1. Malignant neoplasm of prostate ([place]) 185 C61     HISTORY OF PRESENT ILLNESS: Brian Rodriguez is a 70 y.o. male  He was noted to have an elevated PSA of 5.36 by his primary care physician, Dr. Regis Bill.  Accordingly, he was referred for evaluation in urology by Dr. Tresa Moore  on 08/01/15, digital rectal examination was performed at that time revealing no palpable nodules.  The patient proceeded to transrectal ultrasound with 12 biopsies of the prostate on 11/24/15.  The prostate volume measured 37.81 cc. Out of 12 core biopsies, 2 were positive.  The maximum Gleason score was 3+4, and this was seen in left mid and left lateral base, shown in the distribution below.    The patient reviewed the biopsy results with his urologist and he has kindly been referred today for discussion of potential radiation treatment options.   PREVIOUS RADIATION THERAPY: No  PAST MEDICAL HISTORY:  Past Medical History:  Diagnosis Date  . ED (erectile dysfunction)   . H/O echocardiogram 2/10   ef 59 -60 mildly calcific aortic valve  . Headache(784.0)   . Hyperlipidemia   . Hypertension   . Osteopenia   . Prostate cancer ([place])   . Syncope       PAST SURGICAL HISTORY: Past Surgical History:  Procedure Laterality Date  . PROSTATE BIOPSY      FAMILY HISTORY:  Family History  Problem Relation Age of Onset  . Cancer Father     Prostate  . Cancer Brother     Prostate  . Parkinsonism Mother     deceased 62s   . Cancer Sister     abdominal? gi cancer?    His father had prostate cancer and was about 51 when diagnosed. His brother had  prostate cancer and was about 58 when diagnosed.   SOCIAL HISTORY:  Social History   Social History  . Marital status: Married    Spouse name: April Witt  . Number of children: 4  . Years of education: N/A   Occupational History  . Not on file.   Social History Main Topics  . Smoking status: Current Every Day Smoker    Packs/day: 1.00    Years: 35.00    Types: Cigarettes  . Smokeless tobacco: Never Used  . Alcohol use Yes  . Drug use: No  . Sexual activity: Yes     Comment: uses sildenafil 50 mg prn with satisfaction   Other Topics Concern  . 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 aspirin 325 MG tablet Take 325 mg by mouth daily.    Marland Kitchen losartan (COZAAR) 25 MG tablet TAKE 1 TABLET BY MOUTH DAILY 30 tablet 5  . lovastatin (MEVACOR) 40 MG tablet TAKE 1 TABLET (40 MG TOTAL) BY MOUTH AT  BEDTIME. 90 tablet 1  . Multiple Vitamin (MULTIVITAMIN) tablet Take 1 tablet by mouth daily.    . sildenafil (VIAGRA) 50 MG tablet Take 1 tablet (50 mg total) by mouth as needed for erectile dysfunction. 18 tablet 3   No current facility-administered medications for this encounter.     REVIEW OF SYSTEMS:  On review of systems, the patient reports that he is doing well overall. He denies any chest pain, shortness of breath, cough, fevers, chills, night sweats, unintended weight changes. He denies any bowel or bladder disturbances, and denies abdominal pain, nausea or vomiting. He denies any new musculoskeletal or joint aches or pains. He mentions he has nocturia x 1 and ED. He denies dysuria, hematuria, or leakage. He reports intermittent left hip pain after prolonged sitting but he can "normally walk it off". He has an IPSS score of 4, showing mild urinary  symptoms. A complete review of systems is obtained and is otherwise negative.   PHYSICAL EXAM:   height is 5\' 5"  (1.651 m) and weight is 140 lb 3.2 oz (63.6 kg). His blood pressure is 125/65 and his pulse is 63. His respiration is 16 and oxygen saturation is 100%.   Pain Scale 0/10 In general this is a well appearing caucasian male in no acute distress. He is alert and oriented x4 and appropriate throughout the examination. HEENT reveals that the patient is normocephalic, atraumatic. EOMs are intact. PERRLA. Skin is intact without any evidence of gross lesions. Cardiovascular exam reveals a regular rate and rhythm, no clicks rubs or murmurs are auscultated. Chest is clear to auscultation bilaterally. Lymphatic assessment is performed and does not reveal any adenopathy in the cervical, supraclavicular, axillary, or inguinal chains. Abdomen has active bowel sounds in all quadrants and is intact. The abdomen is soft, non tender, non distended. Lower extremities are negative for pretibial pitting edema, deep calf tenderness, cyanosis or clubbing.   KPS = 100  100 - Normal; no complaints; no evidence of disease. 90   - Able to carry on normal activity; minor signs or symptoms of disease. 80   - Normal activity with effort; some signs or symptoms of disease. 58   - Cares for self; unable to carry on normal activity or to do active work. 60   - Requires occasional assistance, but is able to care for most of his personal needs. 50   - Requires considerable assistance and frequent medical care. 72   - Disabled; requires special care and assistance. 75   - Severely disabled; hospital admission is indicated although death not imminent. 41   - Very sick; hospital admission necessary; active supportive treatment necessary. 10   - Moribund; fatal processes progressing rapidly. 0     - Dead  Karnofsky DA, Abelmann Muse, Craver LS and Burchenal [facility] [phone]) The use of the nitrogen mustards in the palliative  treatment of carcinoma: with particular reference to bronchogenic carcinoma Cancer 1 634-56  LABORATORY DATA:  Lab Results  Component Value Date   WBC 8.8 07/19/2015   HGB 16.0 07/19/2015   HCT 47.6 07/19/2015   MCV 93.0 07/19/2015   PLT 351.0 07/19/2015   Lab Results  Component Value Date   NA 141 10/20/2015   K 4.1 10/20/2015   CL 106 10/20/2015   CO2 31 10/20/2015   Lab Results  Component Value Date   ALT 28 07/19/2015   AST 25 07/19/2015   ALKPHOS 59 07/19/2015   BILITOT 0.9 07/19/2015  RADIOGRAPHY: No results found.    IMPRESSION: 70 y.o. 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 He is elligible for prostatectomy, external beam radiotherapy, or radioactive seed implant.  PLAN: Dr. Tammi Klippel reviewed the findings and workup thus far.  We discussed the natural history of prostate cancer.  We reviewed the the implications of T-stage, Gleason's Score, and PSA on decision-making and outcomes in prostate cancer.  We discussed radiation treatment in the management of prostate cancer with regard to the logistics and delivery of external beam radiation treatment as well as the logistics and delivery of prostate brachytherapy.  We compared and contrasted each of these approaches and also compared these against prostatectomy.  The patient expressed interest in prostate brachytherapy and is not interested in surgery. Dr. Tammi Klippel filled out a patient counseling form for him with relevant treatment diagrams and we retained a copy for our records.   We will follow up with him by phone to discuss if he would like to proceed with prostate seed implant in the near future.   The above documentation reflects my direct findings during this shared patient visit. Please see the separate note by Dr. Tammi Klippel on this date for the remainder of the patient's plan of care.   Carola Rhine, PAC    This document  serves as a record of services personally performed by Dr. Tammi Klippel, MD and Shona Simpson, [facility]. It was created on their behalf by Lendon Collar, a trained medical scribe. The creation of this record is based on the scribe's personal observations and the provider's statements to them. This document has been checked and approved by the attending provider.

## 2015-12-29 NOTE — Progress Notes (Signed)
See progress note under physician encounter. 

## 2016-01-18 ENCOUNTER — Telehealth: Payer: Self-pay | Admitting: Radiation Oncology

## 2016-01-18 NOTE — Telephone Encounter (Addendum)
LM for pt to call back about treatment options.

## 2016-01-20 ENCOUNTER — Telehealth: Payer: Self-pay | Admitting: Radiation Oncology

## 2016-01-20 NOTE — Telephone Encounter (Addendum)
I spoke with the patient and he is interested in moving forward with radioactive seed implant. Scheduling notified.

## 2016-02-01 ENCOUNTER — Telehealth: Payer: Self-pay | Admitting: *Deleted

## 2016-02-01 NOTE — Telephone Encounter (Signed)
CALLED PATIENT TO ASK QUESTION, LVM FOR A RETURN CALL 

## 2016-02-07 ENCOUNTER — Telehealth: Payer: Self-pay | Admitting: *Deleted

## 2016-02-07 ENCOUNTER — Other Ambulatory Visit: Payer: Self-pay | Admitting: Urology

## 2016-02-07 NOTE — Telephone Encounter (Signed)
Called patient to inform of pre-seed planning and implant, spoke with patient and he is aware of these appts.

## 2016-03-15 ENCOUNTER — Telehealth: Payer: Self-pay | Admitting: *Deleted

## 2016-03-15 NOTE — Telephone Encounter (Signed)
CALLED PATIENT TO REMIND OF APPTS. FOR 03-16-16, LVM FOR A RETURN CALL

## 2016-03-16 ENCOUNTER — Inpatient Hospital Stay
Admission: RE | Admit: 2016-03-16 | Discharge: 2016-03-16 | Disposition: A | Discharge status: Home / Self Care | Payer: 59 | Source: Ambulatory Visit | Attending: Radiation Oncology | Admitting: Radiation Oncology

## 2016-03-16 ENCOUNTER — Ambulatory Visit: Admission: RE | Admit: 2016-03-16 | Payer: 59 | Source: Ambulatory Visit | Admitting: Radiation Oncology

## 2016-03-16 ENCOUNTER — Ambulatory Visit
Admission: RE | Admit: 2016-03-16 | Discharge: 2016-03-16 | Disposition: A | Discharge status: Home / Self Care | Payer: 59 | Source: Ambulatory Visit | Attending: Radiation Oncology | Admitting: Radiation Oncology

## 2016-03-16 DIAGNOSIS — C61 Malignant neoplasm of prostate: Secondary | ICD-10-CM

## 2016-03-16 NOTE — Progress Notes (Signed)
  Radiation Oncology         [phone]) [phone] ________________________________  Name: Brian Rodriguez MRN: [id number]  Date: 03/16/2016  DOB: 1946-01-22  SIMULATION AND TREATMENT PLANNING NOTE PUBIC ARCH STUDY  [name], MD  Panosh, Standley Brooking, MD  DIAGNOSIS: 70 y.o. gentleman with stage T1c adenocarcinoma of the prostate with a PSA of 5.36, and Gleason Score of 3+4.     ICD-9-CM ICD-10-CM   1. Malignant neoplasm of prostate ([place]) [place]:  The patient presented today for evaluation for possible prostate seed implant. He was brought to the radiation planning suite and placed supine on the CT couch. A 3-dimensional image study set was obtained in upload to the planning computer. There, on each axial slice, I contoured the prostate gland. Then, using three-dimensional radiation planning tools I reconstructed the prostate in view of the structures from the transperineal needle pathway to assess for possible pubic arch interference. In doing so, I did not appreciate any pubic arch interference. Also, the patient's prostate volume was estimated based on the drawn structure. The volume was 44 cc.  Given the pubic arch appearance and prostate volume, patient remains a good candidate to proceed with prostate seed implant. Today, he freely provided informed written consent to proceed.    PLAN: The patient will undergo prostate seed implant.   ________________________________  Sheral Apley. Tammi Klippel, M.D.

## 2016-03-20 ENCOUNTER — Telehealth: Payer: Self-pay | Admitting: *Deleted

## 2016-03-20 NOTE — Telephone Encounter (Signed)
CALLED PATIENT TO REMIND OF CHEST X-RAY AND EKG FOR 03-21-16, LVM FOR A RETURN CALL

## 2016-03-21 ENCOUNTER — Ambulatory Visit (HOSPITAL_BASED_OUTPATIENT_CLINIC_OR_DEPARTMENT_OTHER)
Admission: RE | Admit: 2016-03-21 | Discharge: 2016-03-21 | Disposition: A | Discharge status: Home / Self Care | Payer: 59 | Source: Ambulatory Visit | Attending: Anesthesiology | Admitting: Anesthesiology

## 2016-03-21 ENCOUNTER — Encounter (HOSPITAL_BASED_OUTPATIENT_CLINIC_OR_DEPARTMENT_OTHER)
Admission: RE | Admit: 2016-03-21 | Discharge: 2016-03-21 | Disposition: A | Discharge status: Home / Self Care | Payer: 59 | Source: Ambulatory Visit | Attending: Urology | Admitting: Urology

## 2016-03-21 ENCOUNTER — Other Ambulatory Visit: Payer: Self-pay

## 2016-03-21 DIAGNOSIS — Z01810 Encounter for preprocedural cardiovascular examination: Secondary | ICD-10-CM | POA: Insufficient documentation

## 2016-03-21 DIAGNOSIS — Z01818 Encounter for other preprocedural examination: Secondary | ICD-10-CM | POA: Diagnosis present

## 2016-04-07 ENCOUNTER — Other Ambulatory Visit: Payer: Self-pay | Admitting: Internal Medicine

## 2016-04-10 NOTE — Telephone Encounter (Signed)
Sent to the pharmacy by e-scribe for 6 months.  Pt has upcoming cpx on 07/22/16.  Labs on 07/17/15.

## 2016-05-02 ENCOUNTER — Ambulatory Visit: Admission: RE | Admit: 2016-05-02 | Payer: 59 | Source: Ambulatory Visit

## 2016-05-03 ENCOUNTER — Telehealth: Payer: Self-pay | Admitting: *Deleted

## 2016-05-03 NOTE — Telephone Encounter (Signed)
Called patient to remind of labs for implant on 05-04-16, spoke with patient and he is aware of this lab

## 2016-05-04 ENCOUNTER — Encounter (HOSPITAL_BASED_OUTPATIENT_CLINIC_OR_DEPARTMENT_OTHER)
Admission: RE | Admit: 2016-05-04 | Discharge: 2016-05-04 | Disposition: A | Discharge status: Home / Self Care | Payer: 59 | Source: Ambulatory Visit | Attending: Urology | Admitting: Urology

## 2016-05-04 DIAGNOSIS — Z01812 Encounter for preprocedural laboratory examination: Secondary | ICD-10-CM | POA: Diagnosis present

## 2016-05-04 DIAGNOSIS — C61 Malignant neoplasm of prostate: Secondary | ICD-10-CM | POA: Diagnosis not present

## 2016-05-04 LAB — CBC
HCT: 44.3 % (ref 39.0–52.0)
Hemoglobin: 14.4 g/dL (ref 13.0–17.0)
MCH: 31.2 pg (ref 26.0–34.0)
MCHC: 32.5 g/dL (ref 30.0–36.0)
MCV: 95.9 fL (ref 78.0–100.0)
Platelets: 351 10*3/uL (ref 150–400)
RBC: 4.62 MIL/uL (ref 4.22–5.81)
RDW: 14.2 % (ref 11.5–15.5)
WBC: 6.9 10*3/uL (ref 4.0–10.5)

## 2016-05-04 LAB — COMPREHENSIVE METABOLIC PANEL
ALT: 35 U/L (ref 17–63)
AST: 38 U/L (ref 15–41)
Albumin: 4 g/dL (ref 3.5–5.0)
Alkaline Phosphatase: 55 U/L (ref 38–126)
Anion gap: 7 (ref 5–15)
BUN: 18 mg/dL (ref 6–20)
CO2: 28 mmol/L (ref 22–32)
Calcium: 8.6 mg/dL — ABNORMAL LOW (ref 8.9–10.3)
Chloride: 104 mmol/L (ref 101–111)
Creatinine, Ser: 0.86 mg/dL (ref 0.61–1.24)
GFR calc Af Amer: >60 mL/min (ref >60)
GFR calc non Af Amer: >60 mL/min (ref >60)
Glucose, Bld: 109 mg/dL — ABNORMAL HIGH (ref 65–99)
Potassium: 4.3 mmol/L (ref 3.5–5.1)
Sodium: 139 mmol/L (ref 135–145)
Total Bilirubin: 1.2 mg/dL (ref 0.3–1.2)
Total Protein: 6.7 g/dL (ref 6.5–8.1)

## 2016-05-04 LAB — PROTIME-INR
INR: 0.96
Prothrombin Time: 12.8 seconds (ref 11.4–15.2)

## 2016-05-04 LAB — APTT: aPTT: 29 seconds (ref 24–36)

## 2016-05-07 DIAGNOSIS — C61 Malignant neoplasm of prostate: Secondary | ICD-10-CM | POA: Diagnosis not present

## 2016-05-07 DIAGNOSIS — Z7982 Long term (current) use of aspirin: Secondary | ICD-10-CM | POA: Diagnosis not present

## 2016-05-07 DIAGNOSIS — Z91013 Allergy to seafood: Secondary | ICD-10-CM | POA: Insufficient documentation

## 2016-05-07 DIAGNOSIS — Z79899 Other long term (current) drug therapy: Secondary | ICD-10-CM | POA: Insufficient documentation

## 2016-05-07 DIAGNOSIS — Z888 Allergy status to other drugs, medicaments and biological substances status: Secondary | ICD-10-CM | POA: Insufficient documentation

## 2016-05-08 ENCOUNTER — Encounter (HOSPITAL_BASED_OUTPATIENT_CLINIC_OR_DEPARTMENT_OTHER): Payer: Self-pay | Admitting: *Deleted

## 2016-05-08 NOTE — Progress Notes (Signed)
Pt instructed npo pmn 12/7. X clear liquids until 0600 then nothing by mouth. Pt aware no smoking p mn as well.  To [facility] 12/8 @ 1130.  Labs, ekg, cxr in epic.  Pt to do fleets enema am of surgery.

## 2016-05-10 ENCOUNTER — Telehealth: Payer: Self-pay | Admitting: *Deleted

## 2016-05-10 NOTE — Telephone Encounter (Signed)
Called patient to remind of implant for 05-11-16, no answer will call later

## 2016-05-11 ENCOUNTER — Ambulatory Visit (HOSPITAL_BASED_OUTPATIENT_CLINIC_OR_DEPARTMENT_OTHER): Payer: 59 | Admitting: Anesthesiology

## 2016-05-11 ENCOUNTER — Encounter (HOSPITAL_BASED_OUTPATIENT_CLINIC_OR_DEPARTMENT_OTHER)
Admission: RE | Disposition: A | Discharge status: Home / Self Care | Payer: Self-pay | Source: Ambulatory Visit | Attending: Urology

## 2016-05-11 ENCOUNTER — Ambulatory Visit (HOSPITAL_BASED_OUTPATIENT_CLINIC_OR_DEPARTMENT_OTHER)
Admission: RE | Admit: 2016-05-11 | Discharge: 2016-05-11 | Disposition: A | Discharge status: Home / Self Care | Payer: 59 | Source: Ambulatory Visit | Attending: Urology | Admitting: Urology

## 2016-05-11 ENCOUNTER — Encounter (HOSPITAL_BASED_OUTPATIENT_CLINIC_OR_DEPARTMENT_OTHER): Payer: Self-pay | Admitting: Anesthesiology

## 2016-05-11 ENCOUNTER — Ambulatory Visit (HOSPITAL_COMMUNITY): Payer: 59

## 2016-05-11 DIAGNOSIS — F17210 Nicotine dependence, cigarettes, uncomplicated: Secondary | ICD-10-CM | POA: Diagnosis not present

## 2016-05-11 DIAGNOSIS — Z7982 Long term (current) use of aspirin: Secondary | ICD-10-CM | POA: Diagnosis not present

## 2016-05-11 DIAGNOSIS — C61 Malignant neoplasm of prostate: Secondary | ICD-10-CM | POA: Insufficient documentation

## 2016-05-11 DIAGNOSIS — E785 Hyperlipidemia, unspecified: Secondary | ICD-10-CM | POA: Insufficient documentation

## 2016-05-11 DIAGNOSIS — M8580 Other specified disorders of bone density and structure, unspecified site: Secondary | ICD-10-CM | POA: Insufficient documentation

## 2016-05-11 DIAGNOSIS — Z91013 Allergy to seafood: Secondary | ICD-10-CM | POA: Insufficient documentation

## 2016-05-11 DIAGNOSIS — Z8042 Family history of malignant neoplasm of prostate: Secondary | ICD-10-CM | POA: Insufficient documentation

## 2016-05-11 DIAGNOSIS — Z888 Allergy status to other drugs, medicaments and biological substances status: Secondary | ICD-10-CM | POA: Diagnosis not present

## 2016-05-11 DIAGNOSIS — I10 Essential (primary) hypertension: Secondary | ICD-10-CM | POA: Insufficient documentation

## 2016-05-11 DIAGNOSIS — Z01818 Encounter for other preprocedural examination: Secondary | ICD-10-CM

## 2016-05-11 DIAGNOSIS — N529 Male erectile dysfunction, unspecified: Secondary | ICD-10-CM | POA: Insufficient documentation

## 2016-05-11 HISTORY — DX: Presence of spectacles and contact lenses: Z97.3

## 2016-05-11 HISTORY — PX: RADIOACTIVE SEED IMPLANT: SHX5150

## 2016-05-11 SURGERY — INSERTION, RADIATION SOURCE, PROSTATE
Anesthesia: General | Site: Prostate

## 2016-05-11 MED ORDER — TAMSULOSIN HCL 0.4 MG PO CAPS: 0.4000 mg | ORAL_CAPSULE | Freq: Every day | ORAL | 3 refills | Status: AC

## 2016-05-11 MED ORDER — LIDOCAINE HCL (CARDIAC) 20 MG/ML IV SOLN: INTRAVENOUS | Status: DC | PRN

## 2016-05-11 MED ORDER — TRAMADOL HCL 50 MG PO TABS: 50.0000 mg | ORAL_TABLET | Freq: Four times a day (QID) | ORAL | 0 refills | Status: DC | PRN

## 2016-05-11 MED ORDER — FENTANYL CITRATE (PF) 100 MCG/2ML IJ SOLN
INTRAMUSCULAR | Status: DC | PRN
  Administered 2016-05-11: 50 ug via INTRAVENOUS
  Administered 2016-05-11 (×2): 25 ug via INTRAVENOUS

## 2016-05-11 MED ORDER — SENNOSIDES-DOCUSATE SODIUM 8.6-50 MG PO TABS: 1.0000 | ORAL_TABLET | Freq: Every day | ORAL | 0 refills | Status: DC

## 2016-05-11 MED ORDER — GENTAMICIN SULFATE 40 MG/ML IJ SOLN
5.0000 mg/kg | INTRAVENOUS | Status: AC
  Administered 2016-05-11 (×2): 320 mg via INTRAVENOUS
  Filled 2016-05-11: qty 8

## 2016-05-11 MED ORDER — EPHEDRINE 5 MG/ML INJ
INTRAVENOUS | Status: AC
Start: 2016-05-11 — End: 2016-05-11
  Filled 2016-05-11: qty 10

## 2016-05-11 MED ORDER — PHENYLEPHRINE HCL 10 MG/ML IJ SOLN
INTRAMUSCULAR | Status: AC
  Filled 2016-05-11: qty 1

## 2016-05-11 MED ORDER — FENTANYL CITRATE (PF) 100 MCG/2ML IJ SOLN
INTRAMUSCULAR | Status: AC
  Filled 2016-05-11: qty 2

## 2016-05-11 MED ORDER — ONDANSETRON HCL 4 MG/2ML IJ SOLN
INTRAMUSCULAR | Status: DC | PRN
  Administered 2016-05-11: 4 mg via INTRAVENOUS

## 2016-05-11 MED ORDER — EPHEDRINE SULFATE-NACL 50-0.9 MG/10ML-% IV SOSY
PREFILLED_SYRINGE | INTRAVENOUS | Status: DC | PRN
  Administered 2016-05-11: 10 mg via INTRAVENOUS
  Administered 2016-05-11 (×2): 5 mg via INTRAVENOUS

## 2016-05-11 MED ORDER — OXYCODONE HCL 5 MG PO TABS
5.0000 mg | ORAL_TABLET | Freq: Once | ORAL | Status: DC | PRN
  Filled 2016-05-11: qty 1

## 2016-05-11 MED ORDER — PROMETHAZINE HCL 25 MG/ML IJ SOLN
6.2500 mg | INTRAMUSCULAR | Status: DC | PRN
  Filled 2016-05-11: qty 1

## 2016-05-11 MED ORDER — PROPOFOL 10 MG/ML IV BOLUS
INTRAVENOUS | Status: AC
  Filled 2016-05-11: qty 20

## 2016-05-11 MED ORDER — DEXAMETHASONE SODIUM PHOSPHATE 10 MG/ML IJ SOLN
INTRAMUSCULAR | Status: AC
Start: 2016-05-11 — End: 2016-05-11
  Filled 2016-05-11: qty 1

## 2016-05-11 MED ORDER — GENTAMICIN SULFATE 40 MG/ML IJ SOLN
80.0000 mg | Freq: Once | INTRAMUSCULAR | Status: DC
  Filled 2016-05-11: qty 2

## 2016-05-11 MED ORDER — PHENYLEPHRINE HCL 10 MG/ML IJ SOLN
INTRAMUSCULAR | Status: DC | PRN
  Administered 2016-05-11: 40 ug/min via INTRAVENOUS

## 2016-05-11 MED ORDER — OXYCODONE HCL 5 MG/5ML PO SOLN
5.0000 mg | Freq: Once | ORAL | Status: DC | PRN
  Filled 2016-05-11: qty 5

## 2016-05-11 MED ORDER — DEXAMETHASONE SODIUM PHOSPHATE 4 MG/ML IJ SOLN
INTRAMUSCULAR | Status: DC | PRN
  Administered 2016-05-11: 10 mg via INTRAVENOUS

## 2016-05-11 MED ORDER — ONDANSETRON HCL 4 MG/2ML IJ SOLN
INTRAMUSCULAR | Status: AC
  Filled 2016-05-11: qty 2

## 2016-05-11 MED ORDER — LACTATED RINGERS IV SOLN
INTRAVENOUS | Status: DC
  Administered 2016-05-11 (×2): via INTRAVENOUS
  Filled 2016-05-11: qty 1000

## 2016-05-11 MED ORDER — WHITE PETROLATUM GEL
Status: AC
  Filled 2016-05-11: qty 5

## 2016-05-11 MED ORDER — FLEET ENEMA 7-19 GM/118ML RE ENEM
1.0000 | ENEMA | Freq: Once | RECTAL | Status: AC
  Administered 2016-05-11: 1 via RECTAL
  Filled 2016-05-11: qty 1

## 2016-05-11 MED ORDER — IOHEXOL 300 MG/ML  SOLN
INTRAMUSCULAR | Status: DC | PRN
  Administered 2016-05-11: 7 mL

## 2016-05-11 MED ORDER — LIDOCAINE 2% (20 MG/ML) 5 ML SYRINGE
INTRAMUSCULAR | Status: AC
  Filled 2016-05-11: qty 5

## 2016-05-11 MED ORDER — MEPERIDINE HCL 25 MG/ML IJ SOLN
6.2500 mg | INTRAMUSCULAR | Status: DC | PRN
  Filled 2016-05-11: qty 1

## 2016-05-11 MED ORDER — LIDOCAINE 2% (20 MG/ML) 5 ML SYRINGE
INTRAMUSCULAR | Status: DC | PRN
  Administered 2016-05-11: 80 mg via INTRAVENOUS

## 2016-05-11 MED ORDER — PROPOFOL 10 MG/ML IV BOLUS
INTRAVENOUS | Status: DC | PRN
  Administered 2016-05-11: 150 mg via INTRAVENOUS

## 2016-05-11 MED ORDER — HYDROMORPHONE HCL 1 MG/ML IJ SOLN
0.2500 mg | INTRAMUSCULAR | Status: DC | PRN
  Filled 2016-05-11: qty 0.5

## 2016-05-11 SURGICAL SUPPLY — 27 items
BAG URINE DRAINAGE (UROLOGICAL SUPPLIES) ×2 IMPLANT
BLADE CLIPPER SURG (BLADE) ×2 IMPLANT
CATH FOLEY 2WAY SLVR  5CC 16FR (CATHETERS) ×1
CATH FOLEY 2WAY SLVR 5CC 16FR (CATHETERS) ×1 IMPLANT
CATH ROBINSON RED A/P 20FR (CATHETERS) ×2 IMPLANT
CLOTH BEACON ORANGE TIMEOUT ST (SAFETY) ×2 IMPLANT
COVER BACK TABLE 60X90IN (DRAPES) ×2 IMPLANT
COVER MAYO STAND STRL (DRAPES) ×2 IMPLANT
DRSG TEGADERM 4X4.75 (GAUZE/BANDAGES/DRESSINGS) ×2 IMPLANT
DRSG TEGADERM 8X12 (GAUZE/BANDAGES/DRESSINGS) ×2 IMPLANT
GAUZE SPONGE 4X4 12PLY STRL (GAUZE/BANDAGES/DRESSINGS) ×2 IMPLANT
GLOVE BIO SURGEON STRL SZ7.5 (GLOVE) ×4 IMPLANT
GLOVE ECLIPSE 8.0 STRL XLNG CF (GLOVE) ×8 IMPLANT
GOWN STRL REUS W/ TWL LRG LVL3 (GOWN DISPOSABLE) ×1 IMPLANT
GOWN STRL REUS W/ TWL XL LVL3 (GOWN DISPOSABLE) ×1 IMPLANT
GOWN STRL REUS W/TWL LRG LVL3 (GOWN DISPOSABLE) ×1
GOWN STRL REUS W/TWL XL LVL3 (GOWN DISPOSABLE) ×1
HOLDER FOLEY CATH W/STRAP (MISCELLANEOUS) ×2 IMPLANT
IV NS 1000ML (IV SOLUTION) ×1
IV NS 1000ML BAXH (IV SOLUTION) ×1 IMPLANT
KIT ROOM TURNOVER WOR (KITS) ×2 IMPLANT
PACK CYSTO (CUSTOM PROCEDURE TRAY) ×2 IMPLANT
SPONGE GAUZE 4X4 12PLY STER LF (GAUZE/BANDAGES/DRESSINGS) ×2 IMPLANT
SYRINGE 10CC LL (SYRINGE) ×2 IMPLANT
UNDERPAD 30X30 INCONTINENT (UNDERPADS AND DIAPERS) ×4 IMPLANT
WATER STERILE IRR 500ML POUR (IV SOLUTION) ×2 IMPLANT
seclectSeed I-125 ×150 IMPLANT

## 2016-05-11 NOTE — Anesthesia Procedure Notes (Signed)
Procedure Name: LMA Insertion Date/Time: 05/11/2016 1:15 PM Performed by: Duane Boston Pre-anesthesia Checklist: Patient identified, Emergency Drugs available, Suction available and Patient being monitored Patient Re-evaluated:Patient Re-evaluated prior to inductionOxygen Delivery Method: Circle system utilized Preoxygenation: Pre-oxygenation with 100% oxygen Intubation Type: IV induction Ventilation: Mask ventilation without difficulty LMA: LMA inserted LMA Size: 4.0 Number of attempts: 1 Airway Equipment and Method: Bite block Placement Confirmation: positive ETCO2 Tube secured with: Tape Dental Injury: Teeth and Oropharynx as per pre-operative assessment

## 2016-05-11 NOTE — Transfer of Care (Signed)
Immediate Anesthesia Transfer of Care Note  Patient: Brian Rodriguez  Procedure(s) Performed: Procedure(s): RADIOACTIVE SEED IMPLANT/BRACHYTHERAPY IMPLANT (N/A)  Patient Location: PACU  Anesthesia Type:General  Level of Consciousness: awake, alert  and oriented  Airway & Oxygen Therapy: Patient Spontanous Breathing and Patient connected to nasal cannula oxygen  Post-op Assessment: Report given to RN  Post vital signs: Reviewed and stable  Last Vitals: 132/70, 87, 23, 97%, 97.5 Vitals:   05/11/16 1127  BP: 114/61  Pulse: 70  Resp: 16  Temp: 36.8 C    Last Pain:  Vitals:   05/11/16 1127  TempSrc: Oral      Patients Stated Pain Goal: 4 ([id number])  Complications: No apparent anesthesia complications

## 2016-05-11 NOTE — Discharge Instructions (Signed)
1 - You may have minor urinary urgency (bladder spasms) and bloody urine on / off x few weeks This is normal.  2 - Call MD or go to ER for fever >102, severe pain / nausea / vomiting not relieved by medications, or acute change in medical status  Post Anesthesia Home Care Instructions  Activity: Get plenty of rest for the remainder of the day. A responsible adult should stay with you for 24 hours following the procedure.  For the next 24 hours, DO NOT: -Drive a car -Paediatric nurse -Drink alcoholic beverages -Take any medication unless instructed by your physician -Make any legal decisions or sign important papers.  Meals: Start with liquid foods such as gelatin or soup. Progress to regular foods as tolerated. Avoid greasy, spicy, heavy foods. If nausea and/or vomiting occur, drink only clear liquids until the nausea and/or vomiting subsides. Call your physician if vomiting continues.  Special Instructions/Symptoms: Your throat may feel dry or sore from the anesthesia or the breathing tube placed in your throat during surgery. If this causes discomfort, gargle with warm salt water. The discomfort should disappear within 24 hours.  If you had a scopolamine patch placed behind your ear for the management of post- operative nausea and/or vomiting:  1. The medication in the patch is effective for 72 hours, after which it should be removed.  Wrap patch in a tissue and discard in the trash. Wash hands thoroughly with soap and water. 2. You may remove the patch earlier than 72 hours if you experience unpleasant side effects which may include dry mouth, dizziness or visual disturbances. 3. Avoid touching the patch. Wash your hands with soap and water after contact with the patch.

## 2016-05-11 NOTE — H&P (Signed)
Brian Rodriguez is an 70 y.o. male.    Chief Complaint: Pre-op Brachytherapy Implantation  HPI:   1 - Moderate Risk Prostate Cancer - Pt's brother and father with prostate cancer. BX 2017 o eval PSA 5.36 / DRE 50gm, Rt lateral induration. ==> 33mL no median lobe. GLeason 3+4 = 7 up to 50% LLB, LMB.   PMH sig for HLD, HTN. No CV disease, No strong blood thinners. He still works full time at [employer]. His PCP is Brian Ace MD with Brian Rodriguez.   Today " Brian Rodriguez" is seen to proceed with prostate brachytherapy implantation.    Past Medical History:  Diagnosis Date  . ED (erectile dysfunction)   . H/O echocardiogram 2/10   ef 21 -60 mildly calcific aortic valve  . Headache(784.0)   . Hyperlipidemia   . Hypertension   . Osteopenia   . Prostate cancer ([place])   . Syncope   . Wears glasses     Past Surgical History:  Procedure Laterality Date  . COLONOSCOPY    . PROSTATE BIOPSY      Family History  Problem Relation Age of Onset  . Cancer Father     Prostate  . Cancer Brother     Prostate  . Parkinsonism Mother     deceased 67s   . Cancer Sister     abdominal? gi cancer?    Social History:  reports that he has been smoking Cigarettes.  He has a 35.00 pack-year smoking history. He has never used smokeless tobacco. He reports that he drinks about 2.4 oz of alcohol per week . He reports that he does not use drugs.  Allergies:  Allergies  Allergen Reactions  . Lisinopril Cough  . Shrimp [Shellfish Allergy] Nausea And Vomiting    Diarrhea, and low temp    No prescriptions prior to admission.    No results found for this or any previous visit (from the past 48 hour(s)). No results found.  Review of Systems  Constitutional: Negative.   HENT: Negative.   Eyes: Negative.   Respiratory: Negative.   Cardiovascular: Negative.   Gastrointestinal: Negative.   Genitourinary: Negative.   Musculoskeletal: Negative.   Skin: Negative.   Neurological: Negative.    Endo/Heme/Allergies: Negative.   Psychiatric/Behavioral: Negative.     Height 5\' 5"  (1.651 m), weight 63.5 kg (140 lb). Physical Exam  Constitutional: He is oriented to person, place, and time. He appears well-developed.  HENT:  Head: Normocephalic.  Eyes: Pupils are equal, round, and reactive to light.  Neck: Normal range of motion.  Cardiovascular: Normal rate.   Respiratory: Effort normal.  GI: Soft.  Genitourinary: Penis normal.  Genitourinary Comments: No CVAT  Musculoskeletal: Normal range of motion.  Neurological: He is alert and oriented to person, place, and time.  Skin: Skin is warm.  Psychiatric: He has a normal mood and affect. His behavior is normal. Judgment and thought content normal.     Assessment/Plan  Proceed as planned with brachytherapy seed implantation. Risks, benefits, alternatives, expected peri-op course discussed previously and reiterated today.   Brian Frock, MD 05/11/2016, 6:31 AM

## 2016-05-11 NOTE — Anesthesia Postprocedure Evaluation (Signed)
Anesthesia Post Note  Patient: Brian Rodriguez  Procedure(s) Performed: Procedure(s) (LRB): RADIOACTIVE SEED IMPLANT/BRACHYTHERAPY IMPLANT (N/A)  Patient location during evaluation: PACU Anesthesia Type: General Level of consciousness: awake and alert and patient cooperative Pain management: pain level controlled Vital Signs Assessment: post-procedure vital signs reviewed and stable Respiratory status: spontaneous breathing and respiratory function stable Cardiovascular status: stable Anesthetic complications: no    Last Vitals:  Vitals:   05/11/16 1515 05/11/16 1530  BP: 120/71 118/72  Pulse: 82 85  Resp: 13 16  Temp:      Last Pain:  Vitals:   05/11/16 1127  TempSrc: Oral                 Adalyna Godbee S

## 2016-05-11 NOTE — Progress Notes (Signed)
  Radiation Oncology         (336) [phone] ________________________________  Name: Brian Rodriguez MRN: [id number]  Date: 05/11/2016  DOB: Oct 02, 1945       Prostate Seed Implant  [name], MD  No ref. provider found  DIAGNOSIS:  70 y.o. gentleman with stage T1c adenocarcinoma of the prostate with a PSA of 5.36, and Gleason Score of 3+4.    ICD-9-CM ICD-10-CM   1. Preop examination V72.84 Z01.818 DG Chest 2 View     DG Chest 2 View    PROCEDURE: Insertion of radioactive I-125 seeds into the prostate gland.  RADIATION DOSE: 145 Gy, definitive therapy.  TECHNIQUE: MOXLEY LAMPORT was brought to the operating room with the urologist. He was placed in the dorsolithotomy position. He was catheterized and a rectal tube was inserted. The perineum was shaved, prepped and draped. The ultrasound probe was then introduced into the rectum to see the prostate gland.  TREATMENT DEVICE: A needle grid was attached to the ultrasound probe stand and anchor needles were placed.  3D PLANNING: The prostate was imaged in 3D using a sagittal sweep of the prostate probe. These images were transferred to the planning computer. There, the prostate, urethra and rectum were defined on each axial reconstructed image. Then, the software created an optimized 3D plan and a few seed positions were adjusted. The quality of the plan was reviewed using [facility] information for the target and the following two organs at risk:  Urethra and Rectum.  Then the accepted plan was uploaded to the seed Selectron afterloading unit.  PROSTATE VOLUME STUDY:  Using transrectal ultrasound the volume of the prostate was verified to be 54 cc.  SPECIAL TREATMENT PROCEDURE/SUPERVISION AND HANDLING: The Nucletron FIRST system was used to place the needles under sagittal guidance. A total of 22 needles were used to deposit 75 seeds in the prostate gland. The individual seed activity was 0.497 mCi.  COMPLEX SIMULATION: At the end of the  procedure, an anterior radiograph of the pelvis was obtained to document seed positioning and count. Cystoscopy was performed to check the urethra and bladder.  MICRODOSIMETRY: At the end of the procedure, the patient was emitting 0.181 mR/hr at 1 meter. Accordingly, he was considered safe for hospital discharge.  PLAN: The patient will return to the radiation oncology clinic for post implant CT dosimetry in three weeks.   ________________________________  Sheral Apley Tammi Klippel, M.D.

## 2016-05-11 NOTE — Brief Op Note (Signed)
05/11/2016  2:47 PM  PATIENT:  Brian Rodriguez  70 y.o. male  PRE-OPERATIVE DIAGNOSIS:  PROSTATE CANCER  POST-OPERATIVE DIAGNOSIS:  PROSTATE CANCER  PROCEDURE:  Procedure(s): RADIOACTIVE SEED IMPLANT/BRACHYTHERAPY IMPLANT (N/A)  SURGEON:  Surgeon(s) and Role:    * Alexis Frock, MD - Primary    * Tyler Pita, MD - Assisting    * Rosamaria Lints, MD - Resident - Assisting  PHYSICIAN ASSISTANT:   ASSISTANTS: none   ANESTHESIA:   general  EBL:  Total I/O In: 200 [I.V.:200] Out: -   BLOOD ADMINISTERED:none  DRAINS: none   LOCAL MEDICATIONS USED:  NONE  SPECIMEN:  No Specimen  DISPOSITION OF SPECIMEN:  N/A  COUNTS:  YES  TOURNIQUET:  * No tourniquets in log *  DICTATION: .Other Dictation: Dictation Number [id number]  PLAN OF CARE: Discharge to home after PACU  PATIENT DISPOSITION:  PACU - hemodynamically stable.   Delay start of Pharmacological VTE agent (>24hrs) due to surgical blood loss or risk of bleeding: not applicable

## 2016-05-11 NOTE — Anesthesia Preprocedure Evaluation (Signed)
Anesthesia Evaluation  Patient identified by MRN, date of birth, ID band Patient awake    Reviewed: Allergy & Precautions, NPO status , Patient's Chart, lab work & pertinent test results  Airway Mallampati: II  TM Distance: >3 FB Neck ROM: Full    Dental no notable dental hx.    Pulmonary neg pulmonary ROS, Current Smoker,    Pulmonary exam normal breath sounds clear to auscultation       Cardiovascular hypertension, Pt. on medications Normal cardiovascular exam Rhythm:Regular Rate:Normal     Neuro/Psych  Headaches, PSYCHIATRIC DISORDERS    GI/Hepatic negative GI ROS, Neg liver ROS,   Endo/Other  negative endocrine ROS  Renal/GU negative Renal ROS     Musculoskeletal negative musculoskeletal ROS (+)   Abdominal   Peds  Hematology negative hematology ROS (+)   Anesthesia Other Findings   Reproductive/Obstetrics negative OB ROS                             Anesthesia Physical Anesthesia Plan  ASA: II  Anesthesia Plan: General   Post-op Pain Management:    Induction: Intravenous  Airway Management Planned: LMA  Additional Equipment:   Intra-op Plan:   Post-operative Plan: Extubation in OR  Informed Consent: I have reviewed the patients History and Physical, chart, labs and discussed the procedure including the risks, benefits and alternatives for the proposed anesthesia with the patient or authorized representative who has indicated his/her understanding and acceptance.   Dental advisory given  Plan Discussed with: CRNA  Anesthesia Plan Comments:         Anesthesia Quick Evaluation

## 2016-05-14 ENCOUNTER — Encounter (HOSPITAL_COMMUNITY): Payer: Self-pay | Admitting: Emergency Medicine

## 2016-05-14 ENCOUNTER — Emergency Department (HOSPITAL_COMMUNITY)
Admission: EM | Admit: 2016-05-14 | Discharge: 2016-05-14 | Disposition: A | Discharge status: Home / Self Care | Payer: 59 | Attending: Emergency Medicine | Admitting: Emergency Medicine

## 2016-05-14 DIAGNOSIS — R339 Retention of urine, unspecified: Secondary | ICD-10-CM

## 2016-05-14 DIAGNOSIS — Z79899 Other long term (current) drug therapy: Secondary | ICD-10-CM | POA: Diagnosis not present

## 2016-05-14 DIAGNOSIS — F17210 Nicotine dependence, cigarettes, uncomplicated: Secondary | ICD-10-CM | POA: Diagnosis not present

## 2016-05-14 DIAGNOSIS — Z8546 Personal history of malignant neoplasm of prostate: Secondary | ICD-10-CM | POA: Diagnosis not present

## 2016-05-14 DIAGNOSIS — I10 Essential (primary) hypertension: Secondary | ICD-10-CM | POA: Diagnosis not present

## 2016-05-14 DIAGNOSIS — Z7982 Long term (current) use of aspirin: Secondary | ICD-10-CM | POA: Diagnosis not present

## 2016-05-14 LAB — URINALYSIS, ROUTINE W REFLEX MICROSCOPIC
Bacteria, UA: NONE SEEN
Bilirubin Urine: NEGATIVE
Glucose, UA: NEGATIVE mg/dL
Ketones, ur: NEGATIVE mg/dL
Leukocytes, UA: NEGATIVE
Nitrite: NEGATIVE
Protein, ur: NEGATIVE mg/dL
Specific Gravity, Urine: 1.009 (ref 1.005–1.030)
Squamous Epithelial / LPF: NONE SEEN
pH: 6 (ref 5.0–8.0)

## 2016-05-14 LAB — I-STAT CREATININE, ED: Creatinine, Ser: 0.8 mg/dL (ref 0.61–1.24)

## 2016-05-14 LAB — I-STAT CG4 LACTIC ACID, ED: Lactic Acid, Venous: 1.13 mmol/L (ref 0.5–1.9)

## 2016-05-14 MED ORDER — SODIUM CHLORIDE 0.9 % IV BOLUS (SEPSIS)
1000.0000 mL | Freq: Once | INTRAVENOUS | Status: AC
  Administered 2016-05-14: 1000 mL via INTRAVENOUS

## 2016-05-14 NOTE — ED Provider Notes (Signed)
[facility] DEPT Provider Note   CSN: [id number] Arrival date & time: 05/14/16  0636     History   Chief Complaint Chief Complaint  Patient presents with  . Urinary Retention    HPI Brian Rodriguez is a 70 y.o. male.  He presents for inability to void since yesterday. He took a dose of Flomax, at 3 AM, and 6 AM, but was still unable to void. These are the first time he has taken this medication which was prescribed several days ago. He had radiation seeds for prostate cancer placed by injection, 3 days ago. No prior problems with urinary retention. He did not see blood in the urine prior to the retention onset. He denies fever, chills, nausea or vomiting. He has been able to eat, but eating somewhat less than usual. He denies fever, chills, cough, chest pain, weakness or dizziness. There are no other known modifying factors.  HPI  Past Medical History:  Diagnosis Date  . ED (erectile dysfunction)   . H/O echocardiogram 2/10   ef 66 -60 mildly calcific aortic valve  . Headache(784.0)   . Hyperlipidemia   . Hypertension   . Osteopenia   . Prostate cancer ([place])   . Syncope   . Wears glasses     Patient Active Problem List   Diagnosis Date Noted  . Malignant neoplasm of prostate ([place]) 12/29/2015  . Essential hypertension 07/14/2014  . Neoplasm of uncertain behavior of skin 04/09/2012  . Elevated blood pressure reading 04/09/2012  . Family hx of prostate cancer 04/09/2012  . OSTEOPENIA 12/15/2007  . ERECTILE DYSFUNCTION 04/16/2007  . TOBACCO USE 04/16/2007  . HYPERLIPIDEMIA 04/07/2007    Past Surgical History:  Procedure Laterality Date  . COLONOSCOPY    . PROSTATE BIOPSY    . RADIOACTIVE SEED IMPLANT N/A 05/11/2016   Procedure: RADIOACTIVE SEED IMPLANT/BRACHYTHERAPY IMPLANT;  Surgeon: Alexis Frock, MD;  Location: [facility];  Service: Urology;  Laterality: N/A;       Home Medications    Prior to Admission medications   Medication Sig Start  Date End Date Taking? Authorizing Provider  aspirin 325 MG tablet Take 325 mg by mouth daily as needed for mild pain or headache.    Yes Historical Provider, MD  losartan (COZAAR) 25 MG tablet TAKE 1 TABLET BY MOUTH DAILY 12/12/15  Yes Burnis Medin, MD  lovastatin (MEVACOR) 40 MG tablet TAKE 1 TABLET (40 MG TOTAL) BY MOUTH AT BEDTIME. 04/10/16  Yes Burnis Medin, MD  Multiple Vitamin (MULTIVITAMIN) tablet Take 1 tablet by mouth daily.   Yes Historical Provider, MD  senna-docusate (SENOKOT-S) 8.6-50 MG tablet Take 1 tablet by mouth daily. While taking strong pain meds to prevent constipation. 05/11/16  Yes Alexis Frock, MD  sildenafil (VIAGRA) 50 MG tablet Take 1 tablet (50 mg total) by mouth as needed for erectile dysfunction. 06/17/14  Yes Burnis Medin, MD  tamsulosin (FLOMAX) 0.4 MG CAPS capsule Take 1 capsule (0.4 mg total) by mouth daily. As needed for urinary frequency / urgency 05/11/16  Yes Alexis Frock, MD  traMADol (ULTRAM) 50 MG tablet Take 1-2 tablets (50-100 mg total) by mouth every 6 (six) hours as needed for moderate pain. Post-operatively. 05/11/16  Yes Alexis Frock, MD    Family History Family History  Problem Relation Age of Onset  . Cancer Father     Prostate  . Cancer Brother     Prostate  . Parkinsonism Mother     deceased 14s   .  Cancer Sister     abdominal? gi cancer?     Social History Social History  Substance Use Topics  . Smoking status: Current Every Day Smoker    Packs/day: 1.00    Years: 35.00    Types: Cigarettes  . Smokeless tobacco: Never Used  . Alcohol use 2.4 oz/week    4 Glasses of wine per week     Allergies   Lisinopril and Shrimp [shellfish allergy]   Review of Systems Review of Systems  All other systems reviewed and are negative.    Physical Exam Updated Vital Signs BP 104/64 (BP Location: Left Arm)   Pulse 85   Temp 97.7 F (36.5 C) (Oral)   Resp 15   Ht 5\' 5"  (1.651 m)   Wt 143 lb (64.9 kg)   SpO2 94%   BMI 23.80  kg/m   Physical Exam  Constitutional: He is oriented to person, place, and time. He appears well-developed and well-nourished.  HENT:  Head: Normocephalic and atraumatic.  Right Ear: External ear normal.  Left Ear: External ear normal.  Eyes: Conjunctivae and EOM are normal. Pupils are equal, round, and reactive to light.  Neck: Normal range of motion and phonation normal. Neck supple.  Cardiovascular: Normal rate, regular rhythm and normal heart sounds.   Pulmonary/Chest: Effort normal and breath sounds normal. He exhibits no bony tenderness.  Abdominal: Soft. There is no tenderness.  Musculoskeletal: Normal range of motion.  Neurological: He is alert and oriented to person, place, and time. No cranial nerve deficit or sensory deficit. He exhibits normal muscle tone. Coordination normal.  Skin: Skin is warm, dry and intact.  Psychiatric: He has a normal mood and affect. His behavior is normal. Judgment and thought content normal.  Nursing note and vitals reviewed.    ED Treatments / Results  Labs (all labs ordered are listed, but only abnormal results are displayed) Labs Reviewed  URINALYSIS, ROUTINE W REFLEX MICROSCOPIC - Abnormal; Notable for the following:       Result Value   Color, Urine STRAW (*)    Hgb urine dipstick MODERATE (*)    All other components within normal limits  I-STAT CREATININE, ED  I-STAT CG4 LACTIC ACID, ED    EKG  EKG Interpretation  Date/Time:  Monday May 14 2016 07:49:41 EST Ventricular Rate:  68 PR Interval:    QRS Duration: 92 QT Interval:  404 QTC Calculation: 430 R Axis:   4 Text Interpretation:  Sinus rhythm Borderline T abnormalities, inferior leads Baseline wander in lead(s) V1 since last tracing no significant change Confirmed by Eulis Foster  MD, Tian Davison [id number]) on 05/14/2016 9:29:49 AM       Radiology No results found.  Procedures Procedures (including critical care time)  Medications Ordered in ED Medications  sodium  chloride 0.9 % bolus 1,000 mL (0 mLs Intravenous Stopped 05/14/16 0900)     Initial Impression / Assessment and Plan / ED Course  I have reviewed the triage vital signs and the nursing notes.  Pertinent labs & imaging results that were available during my care of the patient were reviewed by me and considered in my medical decision making (see chart for details).  Clinical Course as of May 15 1111  Mon May 14, 2016  [id number] Blood pressure trending towards improvement, however, lower than baseline, prior measurements reviewed in Epic. Patient would like trying eating and drinking at this time. We'll place, call to urology for consultation.  [EW]  1002 Case discussed with urology,  Dr. Jeffie Pollock, who advises that the patient can follow-up in the office later this week.  [EW]    Clinical Course User Index [EW] Daleen Bo, MD    Medications  sodium chloride 0.9 % bolus 1,000 mL (0 mLs Intravenous Stopped 05/14/16 0900)    Patient Vitals for the past 24 hrs:  BP Temp Temp src Pulse Resp SpO2 Height Weight  05/14/16 0900 104/64 - - 85 15 94 % - -  05/14/16 0830 108/61 - - 84 16 92 % - -  05/14/16 0752 107/66 - - 68 14 93 % - -  05/14/16 0642 - - - - - - 5\' 5"  (1.651 m) 143 lb (64.9 kg)  05/14/16 0641 152/86 97.7 F (36.5 C) Oral 117 15 100 % - -    11:13 AM Reevaluation with update and discussion. After initial assessment and treatment, an updated evaluation reveals patient is comfortable now and blood pressure has stabilized around [id number] systolic. Findings discussed with patient and a family member. The patient is discouraged that he will require continuation of the Foley catheter. He understands that the extra dose of Flomax which he took, is likely causing him to be hypotensive. He will not resume it until tomorrow evening. Gisell Buehrle L    Final Clinical Impressions(s) / ED Diagnoses   Final diagnoses:  Urinary retention   Urinary retention post prostate radiation seed placement.  Patient comfortable now with Foley catheter. He is stable for discharge. Patient has a borderline low blood pressure, which will require him to be careful when standing. Doubt sepsis, metabolic instability or impending vascular collapse.  Nursing Notes Reviewed/ Care Coordinated Applicable Imaging Reviewed Interpretation of Laboratory Data incorporated into ED treatment  The patient appears reasonably screened and/or stabilized for discharge and I doubt any other medical condition or other [facility] requiring further screening, evaluation, or treatment in the ED at this time prior to discharge.  Plan: Home Medications- restart Flomax tomorrow evening. Continue remainder of medications; Home Treatments- rest, Foley catheter care at home; return here if the recommended treatment, does not improve the symptoms; Recommended follow up- urology follow-up, 3 or 4 days    New Prescriptions New Prescriptions   No medications on file     Daleen Bo, MD 05/14/16 1118

## 2016-05-14 NOTE — ED Triage Notes (Signed)
Pt reports having seed implantation done on 05/11/16 for prostate cancer and then yesterday evening pt began having difficulty urinating. Pt reports being unable to urinate since then.

## 2016-05-14 NOTE — Discharge Instructions (Signed)
The catheter needs to stay in until you see the urologist.  Do not take your next Flomax pill, until tomorrow evening. After that, take it daily, to improve the chances that you'll be able to have the catheter removed soon.  Be careful when you stand up since your blood pressure is low following the double dose of Flomax this morning.

## 2016-05-14 NOTE — Op Note (Signed)
NAMEMarland Kitchen  [name], [name] NO.:  [id number]  MEDICAL RECORD NO.:  [id number]  LOCATION:                                 FACILITY:  PHYSICIAN:  Alexis Frock, MD     DATE OF BIRTH:  29-Aug-1945  DATE OF PROCEDURE: 05/11/2016                               OPERATIVE REPORT   DIAGNOSIS:  Prostate cancer procedure.  PROCEDURES: 1. Prostate brachytherapy, seed implantation. 2. Cystoscopy.  ESTIMATED BLOOD LOSS:  Nil.  COMPLICATION:  None.  SPECIMEN:  None.  IMPLANTATION PARAMETERS:  22 catheters used to deliver 75 seeds for total prescribed dose of 145 Gy with iodine 125.  FINDINGS:  Unremarkable urinary bladder and urethra following brachytherapy, seed implantation.  No evidence of seeds within the bladder or urethra.  INDICATION:  Brian Rodriguez is a very vigorous and pleasant 70 year old gentleman, who was found on workup of rising PSA to have adenocarcinoma of the prostate.  Options were discussed for management including surveillance protocols versus ablative therapy versus surgical extirpation.  His prostate size is under 35 g.  He was felt to be an exceptionally good candidate for possible brachytherapy.  He was referred to Radiation Oncology to discuss this possible modalities well and he wished to proceed.  Informed consent was obtained and placed in the medical record for plan for brachytherapy, implantation in conjunction with Brian Rodriguez, Radiation Oncology.  PROCEDURE IN DETAIL:  The patient being Brian Rodriguez was verified. Procedure being brachytherapy seed implantation and cystoscopy was confirmed.  Procedure was carried out.  Time-out was performed. Intravenous antibiotics were administered.  General anesthesia was introduced.  The patient was placed into a high lithotomy position and surgical field was created by prepping the patient, inserting a rectal Foley, inserting a urethral Foley per urethra to straight drain.  The Nucletron  transrectal ultrasound brachytherapy delivery device was used to perform scanning of the prostate and intraoperative planning as per separate radiation oncology note.  After suitable planning, 22 needles were used to deliver 75 seeds as per prescribed plan beginning from the anterior aspect of the prostate towards the base posterior in a systematic fashion.  Following implantation of all seeds, spot fluoroscopic images were obtained, which revealed no obvious extraneous seed placement.  In-site Foley catheter was removed.  Penis was prepped with iodine and flexible cystoscopy was performed.  Flexible cystourethroscopy revealed some mild erythema of the prostatic urethra and the posterior wall of the bladder consistent with irritation from needle placement.  There was no evidence of intraluminal brachytherapy seeds, no papular lesions or calcifications were noted. Retroflexion revealed no additional findings, purposely approximately 150 mL of fluid was left in the urinary bladder and procedure was terminated.  The patient tolerated the procedure well. There were no immediate periprocedural complications.  The patient was taken to the postanesthesia care unit in stable condition.          ______________________________ Alexis Frock, MD     TM/MEDQ  D:  05/11/2016  T:  05/12/2016  Job:  [id number]

## 2016-05-14 NOTE — ED Notes (Signed)
Patient has water in room, Patient states that he has been sipping on water and is able to keep it down, with no nausea or vomiting.

## 2016-05-14 NOTE — ED Notes (Signed)
TOLERATING PO FLUIDS.  

## 2016-05-14 NOTE — ED Notes (Signed)
Pt bladder scanned and showed 600 + urine retention 70F foley cath placed pt had 500 cc of urinary output. Pt report that his discomfort was relieved. Pt wife is at the bedside.

## 2016-05-14 NOTE — ED Notes (Signed)
Leg bag placed on patient. Patient getting dressed. And given instruction for new bag.

## 2016-05-14 NOTE — ED Notes (Signed)
Attempt straight stick for blood draw to left forearm unsuccessful.

## 2016-05-22 ENCOUNTER — Telehealth: Payer: Self-pay | Admitting: *Deleted

## 2016-05-22 NOTE — Telephone Encounter (Signed)
CALLED PATIENT TO LET HIM KNOW THAT HIS APPTS. HAVE BEEN MOVED FROM 06-07-16 TILL 06-08-16, SPOKE WITH PATIENT AND HE IS AWARE  OF THESE CHANGES AND IS GOOD WITH THEM.

## 2016-05-24 ENCOUNTER — Other Ambulatory Visit: Payer: Self-pay | Admitting: Internal Medicine

## 2016-06-07 ENCOUNTER — Ambulatory Visit: Payer: Self-pay | Admitting: Radiation Oncology

## 2016-06-07 ENCOUNTER — Telehealth: Payer: Self-pay | Admitting: *Deleted

## 2016-06-07 ENCOUNTER — Ambulatory Visit: Payer: 59 | Admitting: Radiation Oncology

## 2016-06-07 NOTE — Telephone Encounter (Signed)
CALLED PATIENT TO REMIND OF POST SEED APPTS. FOR 06-08-16, LVM FOR A RETURN CALL

## 2016-06-08 ENCOUNTER — Ambulatory Visit
Admission: RE | Admit: 2016-06-08 | Discharge: 2016-06-08 | Disposition: A | Discharge status: Home / Self Care | Payer: 59 | Source: Ambulatory Visit | Attending: Radiation Oncology | Admitting: Radiation Oncology

## 2016-06-08 VITALS — BP 110/63 | HR 66 | Resp 16

## 2016-06-08 DIAGNOSIS — Z91013 Allergy to seafood: Secondary | ICD-10-CM | POA: Diagnosis not present

## 2016-06-08 DIAGNOSIS — Z79899 Other long term (current) drug therapy: Secondary | ICD-10-CM | POA: Insufficient documentation

## 2016-06-08 DIAGNOSIS — R350 Frequency of micturition: Secondary | ICD-10-CM | POA: Diagnosis not present

## 2016-06-08 DIAGNOSIS — Z888 Allergy status to other drugs, medicaments and biological substances status: Secondary | ICD-10-CM | POA: Diagnosis not present

## 2016-06-08 DIAGNOSIS — Z7982 Long term (current) use of aspirin: Secondary | ICD-10-CM | POA: Diagnosis not present

## 2016-06-08 DIAGNOSIS — Z923 Personal history of irradiation: Secondary | ICD-10-CM | POA: Diagnosis not present

## 2016-06-08 DIAGNOSIS — C61 Malignant neoplasm of prostate: Secondary | ICD-10-CM

## 2016-06-08 DIAGNOSIS — R3911 Hesitancy of micturition: Secondary | ICD-10-CM | POA: Diagnosis not present

## 2016-06-08 NOTE — Progress Notes (Signed)
  Radiation Oncology         [phone]) [phone] ________________________________  Name: JAMASON LANDIS MRN: [id number]  Date: 06/08/2016  DOB: 01-Feb-1946  COMPLEX SIMULATION NOTE  NARRATIVE:  The patient was brought to the [place] suite today following prostate seed implantation approximately one month ago.  Identity was confirmed.  All relevant records and images related to the planned course of therapy were reviewed.  Then, the patient was set-up supine.  CT images were obtained.  The CT images were loaded into the planning software.  Then the prostate and rectum were contoured.  Treatment planning then occurred.  The implanted iodine 125 seeds were identified by the physics staff for projection of radiation distribution  I have requested : 3D Simulation  I have requested a DVH of the following structures: Prostate and rectum.    ________________________________  Sheral Apley Tammi Klippel, M.D.  This document serves as a record of services personally performed by Tyler Pita, MD. It was created on his behalf by Maryla Morrow, a trained medical scribe. The creation of this record is based on the scribe's personal observations and the provider's statements to them. This document has been checked and approved by the attending provider.

## 2016-06-08 NOTE — Progress Notes (Signed)
Vitals stable. Denies pain. Pre seed IPSS 4. Post seed IPSS 6. Denies dysuria, hematuria, leakage or incontinence. Reports following surgery he presented to the ED unable to void. Reports a catheter was placed and removed by Dr. Zettie Pho PA last Friday. Reports since that time he has been taking Flomax as directed. Denies any further issues with urinary retention. Scheduled to follow up with Dr. Tresa Moore again in March.   BP 110/63 (BP Location: Right Arm, Patient Position: Sitting, Cuff Size: Normal)   Pulse 66   Resp 16   SpO2 100%  Wt Readings from Last 3 Encounters:  05/14/16 143 lb (64.9 kg)  05/11/16 143 lb (64.9 kg)  12/29/15 140 lb 3.2 oz (63.6 kg)

## 2016-06-08 NOTE — Progress Notes (Signed)
Radiation Oncology         [phone]) [phone] ________________________________  Name: Brian Rodriguez MRN: [id number]  Date: 06/08/2016  DOB: 01-Mar-1946  Follow-Up Visit Note  CC: Lottie Dawson, MD  Burnis Medin, MD  Diagnosis:   71 y.o. gentleman with stage T1c adenocarcinoma of the prostate with a PSA of 5.36, and Gleason Score of 3+4.  Interval Since Last Radiation:  Radioactive seed implant on 05/11/16  Narrative:  The patient returns today for routine follow-up.  He is complaining of increased urinary frequency and urinary hesitation symptoms. He filled out a questionnaire regarding urinary function today providing and overall IPSS score of 6 characterizing his symptoms as mild.  His pre-implant score was 4.  Vitals stable. The patient denies pain. He denies dysuria, hematuria, leakage or incontinence. He reports that following surgery he reported to the ED unable to void. A catheter was placed and removed by Dr. Zettie Pho PA on 06/01/16. He has been taking Flomax as directed since that time, and denies any further issues. He is scheduled to follow up with Dr. Tresa Moore again in March.  ALLERGIES:  is allergic to lisinopril and shrimp [shellfish allergy].  Meds: Current Outpatient Prescriptions  Medication Sig Dispense Refill  . aspirin 325 MG tablet Take 325 mg by mouth daily as needed for mild pain or headache.     . losartan (COZAAR) 25 MG tablet TAKE 1 TABLET BY MOUTH DAILY 30 tablet 5  . lovastatin (MEVACOR) 40 MG tablet TAKE 1 TABLET (40 MG TOTAL) BY MOUTH AT BEDTIME. 90 tablet 1  . Multiple Vitamin (MULTIVITAMIN) tablet Take 1 tablet by mouth daily.    Marland Kitchen senna-docusate (SENOKOT-S) 8.6-50 MG tablet Take 1 tablet by mouth daily. While taking strong pain meds to prevent constipation. 10 tablet 0  . sildenafil (VIAGRA) 50 MG tablet Take 1 tablet (50 mg total) by mouth as needed for erectile dysfunction. 18 tablet 3  . tamsulosin (FLOMAX) 0.4 MG CAPS capsule Take 1 capsule (0.4 mg total)  by mouth daily. As needed for urinary frequency / urgency 30 capsule 3  . traMADol (ULTRAM) 50 MG tablet Take 1-2 tablets (50-100 mg total) by mouth every 6 (six) hours as needed for moderate pain. Post-operatively. 20 tablet 0   No current facility-administered medications for this encounter.     Physical Findings: The patient is in no acute distress. Patient is alert and oriented.  blood pressure is 110/63 and his pulse is 66. His respiration is 16 and oxygen saturation is 100%.   No significant changes.  Lab Findings: Lab Results  Component Value Date   WBC 6.9 05/04/2016   HGB 14.4 05/04/2016   HCT 44.3 05/04/2016   MCV 95.9 05/04/2016   PLT 351 05/04/2016    Radiographic Findings:  Patient underwent CT imaging in our clinic for post implant dosimetry. The CT appears to demonstrate an adequate distribution of radioactive seeds throughout the prostate gland. There no seeds in her near the rectum. I suspect the final radiation plan and dosimetry will show appropriate coverage of the prostate gland.   Impression: The patient is recovering from the effects of radiation. His urinary symptoms should gradually improve over the next 4-6 months. We talked about this today. He is encouraged by his improvement already and is otherwise please with his outcome.   Plan: Today, I spent time talking to the patient about his prostate seed implant and resolving urinary symptoms. We also talked about long-term follow-up for prostate cancer following seed  implant. He understands that ongoing PSA determinations and digital rectal exams will help perform surveillance to rule out disease recurrence. He understands what to expect with his PSA measures. Patient was also educated today about some of the long-term effects from radiation including a small risk for rectal bleeding and possibly erectile dysfunction. We talked about some of the general management approaches to these potential complications. However, I  did encourage the patient to contact our office or return at any point if he has questions or concerns related to his previous radiation and prostate cancer.  _____________________________________  Sheral Apley. Tammi Klippel, M.D.  This document serves as a record of services personally performed by Tyler Pita, MD. It was created on his behalf by Maryla Morrow, a trained medical scribe. The creation of this record is based on the scribe's personal observations and the provider's statements to them. This document has been checked and approved by the attending provider.

## 2016-06-11 DIAGNOSIS — C61 Malignant neoplasm of prostate: Secondary | ICD-10-CM | POA: Diagnosis not present

## 2016-06-11 NOTE — Addendum Note (Signed)
Encounter addended by: Fredericka Bottcher C Ceciley Buist, RN on: 06/11/2016 11:36 AM<BR>    Actions taken: Charge Capture section accepted

## 2016-06-16 ENCOUNTER — Encounter: Payer: Self-pay | Admitting: Radiation Oncology

## 2016-06-16 NOTE — Progress Notes (Signed)
  Radiation Oncology         [phone]) [phone] ________________________________  Name: CRIXUS HOUT MRN: [id number]  Date: 06/16/2016  DOB: 05-03-1946  3D Planning Note   Prostate Brachytherapy Post-Implant Dosimetry  Diagnosis:  71 y.o. gentleman with stage T1c adenocarcinoma of the prostate with a PSA of 5.36, and Gleason Score of 3+4.  Narrative: On a previous date, KIREN HAVRON returned following prostate seed implantation for post implant planning. He underwent CT scan complex simulation to delineate the three-dimensional structures of the pelvis and demonstrate the radiation distribution.  Since that time, the seed localization, and complex isodose planning with dose volume histograms have now been completed.  Results:   Prostate Coverage - The dose of radiation delivered to the 90% or more of the prostate gland (D90) was 115.04% of the prescription dose. This exceeds our goal of greater than 90%. Rectal Sparing - The volume of rectal tissue receiving the prescription dose or higher was 0.0 cc. This falls under our thresholds tolerance of 1.0 cc.  Impression: The prostate seed implant appears to show adequate target coverage and appropriate rectal sparing.  Plan:  The patient will continue to follow with urology for ongoing PSA determinations. I would anticipate a high likelihood for local tumor control with minimal risk for rectal morbidity.  ________________________________  Sheral Apley Tammi Klippel, M.D.

## 2016-07-16 ENCOUNTER — Other Ambulatory Visit: Payer: 59

## 2016-07-23 ENCOUNTER — Encounter: Payer: 59 | Admitting: Internal Medicine

## 2016-09-28 ENCOUNTER — Telehealth: Payer: Self-pay | Admitting: Internal Medicine

## 2016-09-28 NOTE — Telephone Encounter (Signed)
Sending in a refill for pt until he can get on the schedule for his annual. Please schedule for his yearly physical

## 2016-10-01 NOTE — Telephone Encounter (Signed)
lmom for pt to sch an appt °

## 2016-10-05 NOTE — Telephone Encounter (Signed)
lmom for pt to call back

## 2016-10-08 NOTE — Telephone Encounter (Signed)
Pt has been sch for annual on 01-01-17

## 2016-10-09 ENCOUNTER — Ambulatory Visit: Payer: 59 | Admitting: Adult Health

## 2016-11-25 ENCOUNTER — Other Ambulatory Visit: Payer: Self-pay | Admitting: Internal Medicine

## 2016-12-24 ENCOUNTER — Other Ambulatory Visit: Payer: Self-pay | Admitting: Internal Medicine

## 2016-12-31 NOTE — Progress Notes (Signed)
Chief Complaint  Patient presents with  . Annual Exam    HPI: Patient  Brian Rodriguez  71 y.o. comes in today for Preventive Health Care visit  And Chronic disease management  BP  No  Se of med bp seems controlled  LIPIDStatin med no se of med  Prosate cancer    Under rx   Fu dr Tammi Klippel  In next few months . brachy therapy  Tobacco still  Smoking  Not ready to quit    Health Maintenance  Topic Date Due  . COLONOSCOPY  06/06/2013  . Hepatitis C Screening  01/01/2018 (Originally [birth year])  . INFLUENZA VACCINE  01/02/2017  . TETANUS/TDAP  04/26/2020  . PNA vac Low Risk Adult  Completed   Health Maintenance Review LIFESTYLE:  Exercise:   Work  Long shifts on feet  Tobacco/ETS: 1ppd   Not ready to quit  Alcohol:  Not much  Sugar beverages: low cal gatorade .     Work day s.  Sleep:   6 hours   Drug use: no HH of   2  1 boston terrier  Partner smokes alos  Work:  40     ROS:  GEN/ HEENT: No fever, significant weight changes sweats headaches vision problems hearing changes, CV/ PULM; No chest pain shortness of breath cough, syncope,edema  change in exercise tolerance. GI /GU: No adominal pain, vomiting, change in bowel habits. No blood in the stool. No significant GU symptoms. SKIN/HEME: ,no acute skin rashes suspicious lesions or bleeding. No lymphadenopathy, nodules, masses.  NEURO/ PSYCH:  No neurologic signs such as weakness numbness. No depression anxiety. IMM/ Allergy: No unusual infections.  Allergy .   REST of 12 system review negative except as per HPI   Past Medical History:  Diagnosis Date  . ED (erectile dysfunction)   . H/O echocardiogram 2/10   ef 79 -60 mildly calcific aortic valve  . Headache(784.0)   . Hyperlipidemia   . Hypertension   . Osteopenia   . Prostate cancer ([place])   . Syncope   . Wears glasses     Past Surgical History:  Procedure Laterality Date  . COLONOSCOPY    . PROSTATE BIOPSY    . RADIOACTIVE SEED IMPLANT N/A 05/11/2016   Procedure: RADIOACTIVE SEED IMPLANT/BRACHYTHERAPY IMPLANT;  Surgeon: Alexis Frock, MD;  Location: [facility];  Service: Urology;  Laterality: N/A;    Family History  Problem Relation Age of Onset  . Cancer Father        Prostate  . Cancer Brother        Prostate  . Parkinsonism Mother        deceased 74s   . Cancer Sister        abdominal? gi cancer?     Social History   Social History  . Marital status: Married    Spouse name: April Wilsey  . Number of children: 4  . Years of education: N/A   Social History Main Topics  . Smoking status: Current Every Day Smoker    Packs/day: 1.00    Years: 35.00    Types: Cigarettes  . Smokeless tobacco: Never Used  . Alcohol use 2.4 oz/week    4 Glasses of wine per week  . Drug use: No  . Sexual activity: Yes     Comment: uses sildenafil 50 mg prn with satisfaction   Other Topics Concern  . None   Social History Narrative   HH of 3  Now  for 10 years    Married  Divorced x 2    Works [employer]  35- 48 per week 12 hour shifts   1ppd tobacco    1 dog    Outpatient Medications Prior to Visit  Medication Sig Dispense Refill  . aspirin 325 MG tablet Take 325 mg by mouth daily as needed for mild pain or headache.     . losartan (COZAAR) 25 MG tablet TAKE 1 TABLET BY MOUTH DAILY 30 tablet 5  . lovastatin (MEVACOR) 40 MG tablet TAKE 1 TABLET (40 MG TOTAL) BY MOUTH AT BEDTIME. 30 tablet 0  . Multiple Vitamin (MULTIVITAMIN) tablet Take 1 tablet by mouth daily.    Marland Kitchen senna-docusate (SENOKOT-S) 8.6-50 MG tablet Take 1 tablet by mouth daily. While taking strong pain meds to prevent constipation. 10 tablet 0  . sildenafil (VIAGRA) 50 MG tablet Take 1 tablet (50 mg total) by mouth as needed for erectile dysfunction. 18 tablet 3  . tamsulosin (FLOMAX) 0.4 MG CAPS capsule Take 1 capsule (0.4 mg total) by mouth daily. As needed for urinary frequency / urgency 30 capsule 3  . traMADol (ULTRAM) 50 MG tablet Take 1-2 tablets (50-100  mg total) by mouth every 6 (six) hours as needed for moderate pain. Post-operatively. 20 tablet 0   No facility-administered medications prior to visit.      EXAM:  BP 104/76 (BP Location: Left Arm, Patient Position: Sitting, Cuff Size: Normal)   Pulse 91   Temp 98.3 F (36.8 C) (Oral)   Ht 5' 3.5" (1.613 m)   Wt 141 lb 12.8 oz (64.3 kg)   BMI 24.72 kg/m   Body mass index is 24.72 kg/m. Wt Readings from Last 3 Encounters:  01/01/17 141 lb 12.8 oz (64.3 kg)  05/14/16 143 lb (64.9 kg)  05/11/16 143 lb (64.9 kg)    Physical Exam: Vital signs reviewed XNA:TFTD is a well-developed well-nourished alert cooperative    who appearsr stated age in no acute distress.  HEENT: normocephalic atraumatic , Eyes: PERRL EOM's full, conjunctiva clear, Nares: paten,t no deformity glasses  discharge or tenderness., Ears: no deformity EAC's clear TMs with normal landmarks. Mouth: clear OP, no lesions, edema.  Moist mucous membranes. Dentition in adequate repair. NECK: supple without masses, thyromegaly or bruits. CHEST/PULM:  Clear to auscultation and percussion breath sounds equal no wheeze , rales or rhonchi. No chest wall deformities or tenderness. Breast: normal by inspection . No dimpling, discharge, masses, tenderness or discharge . CV: PMI is nondisplaced, S1 S2 no gallops, murmurs, rubs. Peripheral pulses are full without delay.No JVD .  ABDOMEN: Bowel sounds normal nontender  No guard or rebound, no hepato splenomegal no CVA tenderness.   Extremtities:  No clubbing cyanosis or edema, no acute joint swelling or redness no focal atrophy NEURO:  Oriented x3, cranial nerves 3-12 appear to be intact, no obvious focal weakness,gait within normal limits no abnormal reflexes or asymmetrical SKIN: No acute rashes normal turgor, color, no bruising or petechiae. PSYCH: Oriented, good eye contact, no obvious depression anxiety, cognition and judgment appear normal. LN: no cervical axillary inguinal  adenopathy  Lab Results  Component Value Date   WBC 6.9 05/04/2016   HGB 14.4 05/04/2016   HCT 44.3 05/04/2016   PLT 351 05/04/2016   GLUCOSE 109 (H) 05/04/2016   CHOL 151 07/19/2015   TRIG 99.0 07/19/2015   HDL 51.30 07/19/2015   LDLDIRECT 152.3 04/07/2007   LDLCALC 80 07/19/2015   ALT 35 05/04/2016   AST  38 05/04/2016   NA 139 05/04/2016   K 4.3 05/04/2016   CL 104 05/04/2016   CREATININE 0.80 05/14/2016   BUN 18 05/04/2016   CO2 28 05/04/2016   TSH 1.30 07/19/2015   PSA 5.36 (H) 07/19/2015   INR 0.96 05/04/2016    BP Readings from Last 3 Encounters:  01/01/17 104/76  06/08/16 110/63  05/14/16 152/70   Wt Readings from Last 3 Encounters:  01/01/17 141 lb 12.8 oz (64.3 kg)  05/14/16 143 lb (64.9 kg)  05/11/16 143 lb (64.9 kg)    Lab results reviewed with patient   ASSESSMENT AND PLAN:  Discussed the following assessment and plan:  Visit for preventive health examination - Plan: Basic metabolic panel, CBC with Differential/Platelet, Hemoglobin A1c, Hepatic function panel, Lipid panel  Hyperlipidemia, unspecified hyperlipidemia type - Plan: Basic metabolic panel, CBC with Differential/Platelet, Hemoglobin A1c, Hepatic function panel, Lipid panel  Essential hypertension - Plan: Basic metabolic panel, CBC with Differential/Platelet, Hemoglobin A1c, Hepatic function panel, Lipid panel  Tobacco use disorder - counsled not ready to quit  Malignant neoplasm of prostate ([place]) - Plan: Basic metabolic panel, CBC with Differential/Platelet, Hemoglobin A1c, Hepatic function panel, Lipid panel  Medication management - Plan: Basic metabolic panel, CBC with Differential/Platelet, Hemoglobin A1c, Hepatic function panel, Lipid panel Come back for fasting labs   Overdue for  Colon screen  Check into cologuard  Disc tobacco cessation pt not ready .  No chang e in meds at this time Patient Care Team: Burnis Medin, MD as PCP - General Patient Instructions   Fasting lab    Check on colon cancer screening  Advise stop tobacco  If all ok with tlab then yearly check up .      Preventive Care 36 Years and Older, Male Preventive care refers to lifestyle choices and visits with your health care provider that can promote health and wellness. What does preventive care include?  A yearly physical exam. This is also called an annual well check.  Dental exams once or twice a year.  Routine eye exams. Ask your health care provider how often you should have your eyes checked.  Personal lifestyle choices, including: ? Daily care of your teeth and gums. ? Regular physical activity. ? Eating a healthy diet. ? Avoiding tobacco and drug use. ? Limiting alcohol use. ? Practicing safe sex. ? Taking low doses of aspirin every day. ? Taking vitamin and mineral supplements as recommended by your health care provider. What happens during an annual well check? The services and screenings done by your health care provider during your annual well check will depend on your age, overall health, lifestyle risk factors, and family history of disease. Counseling Your health care provider may ask you questions about your:  Alcohol use.  Tobacco use.  Drug use.  Emotional well-being.  Home and relationship well-being.  Sexual activity.  Eating habits.  History of falls.  Memory and ability to understand (cognition).  Work and work Statistician.  Screening You may have the following tests or measurements:  Height, weight, and BMI.  Blood pressure.  Lipid and cholesterol levels. These may be checked every 5 years, or more frequently if you are over 60 years old.  Skin check.  Lung cancer screening. You may have this screening every year starting at age 89 if you have a 30-pack-year history of smoking and currently smoke or have quit within the past 15 years.  Fecal occult blood test (FOBT) of the stool. You  may have this test every year starting at age  73.  Flexible sigmoidoscopy or colonoscopy. You may have a sigmoidoscopy every 5 years or a colonoscopy every 10 years starting at age 53.  Prostate cancer screening. Recommendations will vary depending on your family history and other risks.  Hepatitis C blood test.  Hepatitis B blood test.  Sexually transmitted disease (STD) testing.  Diabetes screening. This is done by checking your blood sugar (glucose) after you have not eaten for a while (fasting). You may have this done every 1-3 years.  Abdominal aortic aneurysm (AAA) screening. You may need this if you are a current or former smoker.  Osteoporosis. You may be screened starting at age 89 if you are at high risk.  Talk with your health care provider about your test results, treatment options, and if necessary, the need for more tests. Vaccines Your health care provider may recommend certain vaccines, such as:  Influenza vaccine. This is recommended every year.  Tetanus, diphtheria, and acellular pertussis (Tdap, Td) vaccine. You may need a Td booster every 10 years.  Varicella vaccine. You may need this if you have not been vaccinated.  Zoster vaccine. You may need this after age 74.  Measles, mumps, and rubella (MMR) vaccine. You may need at least one dose of MMR if you were born in 1957 or later. You may also need a second dose.  Pneumococcal 13-valent conjugate (PCV13) vaccine. One dose is recommended after age 34.  Pneumococcal polysaccharide (PPSV23) vaccine. One dose is recommended after age 75.  Meningococcal vaccine. You may need this if you have certain conditions.  Hepatitis A vaccine. You may need this if you have certain conditions or if you travel or work in places where you may be exposed to hepatitis A.  Hepatitis B vaccine. You may need this if you have certain conditions or if you travel or work in places where you may be exposed to hepatitis B.  Haemophilus influenzae type b (Hib) vaccine. You may  need this if you have certain risk factors.  Talk to your health care provider about which screenings and vaccines you need and how often you need them. This information is not intended to replace advice given to you by your health care provider. Make sure you discuss any questions you have with your health care provider. Document Released: 06/17/2015 Document Revised: 02/08/2016 Document Reviewed: 03/22/2015 Elsevier Interactive Patient Education  2017 [employer] K. Iva Posten M.D.

## 2017-01-01 ENCOUNTER — Ambulatory Visit (INDEPENDENT_AMBULATORY_CARE_PROVIDER_SITE_OTHER): Payer: 59 | Admitting: Internal Medicine

## 2017-01-01 ENCOUNTER — Encounter: Payer: Self-pay | Admitting: Internal Medicine

## 2017-01-01 VITALS — BP 104/76 | HR 91 | Temp 98.3°F | Ht 63.5 in | Wt 141.8 lb

## 2017-01-01 DIAGNOSIS — E785 Hyperlipidemia, unspecified: Secondary | ICD-10-CM | POA: Diagnosis not present

## 2017-01-01 DIAGNOSIS — C61 Malignant neoplasm of prostate: Secondary | ICD-10-CM | POA: Diagnosis not present

## 2017-01-01 DIAGNOSIS — Z0000 Encounter for general adult medical examination without abnormal findings: Secondary | ICD-10-CM

## 2017-01-01 DIAGNOSIS — I10 Essential (primary) hypertension: Secondary | ICD-10-CM

## 2017-01-01 DIAGNOSIS — F17200 Nicotine dependence, unspecified, uncomplicated: Secondary | ICD-10-CM

## 2017-01-01 DIAGNOSIS — Z79899 Other long term (current) drug therapy: Secondary | ICD-10-CM

## 2017-01-01 NOTE — Patient Instructions (Addendum)
Fasting lab   Check on colon cancer screening  Advise stop tobacco  If all ok with tlab then yearly check up .      Preventive Care 63 Years and Older, Male Preventive care refers to lifestyle choices and visits with your health care provider that can promote health and wellness. What does preventive care include?  A yearly physical exam. This is also called an annual well check.  Dental exams once or twice a year.  Routine eye exams. Ask your health care provider how often you should have your eyes checked.  Personal lifestyle choices, including: ? Daily care of your teeth and gums. ? Regular physical activity. ? Eating a healthy diet. ? Avoiding tobacco and drug use. ? Limiting alcohol use. ? Practicing safe sex. ? Taking low doses of aspirin every day. ? Taking vitamin and mineral supplements as recommended by your health care provider. What happens during an annual well check? The services and screenings done by your health care provider during your annual well check will depend on your age, overall health, lifestyle risk factors, and family history of disease. Counseling Your health care provider may ask you questions about your:  Alcohol use.  Tobacco use.  Drug use.  Emotional well-being.  Home and relationship well-being.  Sexual activity.  Eating habits.  History of falls.  Memory and ability to understand (cognition).  Work and work Statistician.  Screening You may have the following tests or measurements:  Height, weight, and BMI.  Blood pressure.  Lipid and cholesterol levels. These may be checked every 5 years, or more frequently if you are over 54 years old.  Skin check.  Lung cancer screening. You may have this screening every year starting at age 66 if you have a 30-pack-year history of smoking and currently smoke or have quit within the past 15 years.  Fecal occult blood test (FOBT) of the stool. You may have this test every year  starting at age 65.  Flexible sigmoidoscopy or colonoscopy. You may have a sigmoidoscopy every 5 years or a colonoscopy every 10 years starting at age 23.  Prostate cancer screening. Recommendations will vary depending on your family history and other risks.  Hepatitis C blood test.  Hepatitis B blood test.  Sexually transmitted disease (STD) testing.  Diabetes screening. This is done by checking your blood sugar (glucose) after you have not eaten for a while (fasting). You may have this done every 1-3 years.  Abdominal aortic aneurysm (AAA) screening. You may need this if you are a current or former smoker.  Osteoporosis. You may be screened starting at age 38 if you are at high risk.  Talk with your health care provider about your test results, treatment options, and if necessary, the need for more tests. Vaccines Your health care provider may recommend certain vaccines, such as:  Influenza vaccine. This is recommended every year.  Tetanus, diphtheria, and acellular pertussis (Tdap, Td) vaccine. You may need a Td booster every 10 years.  Varicella vaccine. You may need this if you have not been vaccinated.  Zoster vaccine. You may need this after age 44.  Measles, mumps, and rubella (MMR) vaccine. You may need at least one dose of MMR if you were born in 1957 or later. You may also need a second dose.  Pneumococcal 13-valent conjugate (PCV13) vaccine. One dose is recommended after age 54.  Pneumococcal polysaccharide (PPSV23) vaccine. One dose is recommended after age 49.  Meningococcal vaccine. You may need  this if you have certain conditions.  Hepatitis A vaccine. You may need this if you have certain conditions or if you travel or work in places where you may be exposed to hepatitis A.  Hepatitis B vaccine. You may need this if you have certain conditions or if you travel or work in places where you may be exposed to hepatitis B.  Haemophilus influenzae type b (Hib)  vaccine. You may need this if you have certain risk factors.  Talk to your health care provider about which screenings and vaccines you need and how often you need them. This information is not intended to replace advice given to you by your health care provider. Make sure you discuss any questions you have with your health care provider. Document Released: 06/17/2015 Document Revised: 02/08/2016 Document Reviewed: 03/22/2015 Elsevier Interactive Patient Education  2017 [employer].

## 2017-01-04 ENCOUNTER — Other Ambulatory Visit (INDEPENDENT_AMBULATORY_CARE_PROVIDER_SITE_OTHER): Payer: 59

## 2017-01-04 DIAGNOSIS — I10 Essential (primary) hypertension: Secondary | ICD-10-CM

## 2017-01-04 DIAGNOSIS — E785 Hyperlipidemia, unspecified: Secondary | ICD-10-CM | POA: Diagnosis not present

## 2017-01-04 DIAGNOSIS — C61 Malignant neoplasm of prostate: Secondary | ICD-10-CM

## 2017-01-04 DIAGNOSIS — Z79899 Other long term (current) drug therapy: Secondary | ICD-10-CM

## 2017-01-04 DIAGNOSIS — Z0000 Encounter for general adult medical examination without abnormal findings: Secondary | ICD-10-CM

## 2017-01-04 LAB — CBC WITH DIFFERENTIAL/PLATELET
Basophils Absolute: 0 10*3/uL (ref 0.0–0.1)
Basophils Relative: 0.7 % (ref 0.0–3.0)
Eosinophils Absolute: 0.2 10*3/uL (ref 0.0–0.7)
Eosinophils Relative: 3.9 % (ref 0.0–5.0)
HCT: 44.4 % (ref 39.0–52.0)
Hemoglobin: 14.6 g/dL (ref 13.0–17.0)
Lymphocytes Relative: 27.8 % (ref 12.0–46.0)
Lymphs Abs: 1.6 10*3/uL (ref 0.7–4.0)
MCHC: 32.9 g/dL (ref 30.0–36.0)
MCV: 95.7 fl (ref 78.0–100.0)
Monocytes Absolute: 0.6 10*3/uL (ref 0.1–1.0)
Monocytes Relative: 10.7 % (ref 3.0–12.0)
Neutro Abs: 3.2 10*3/uL (ref 1.4–7.7)
Neutrophils Relative %: 56.9 % (ref 43.0–77.0)
Platelets: 313 10*3/uL (ref 150.0–400.0)
RBC: 4.64 Mil/uL (ref 4.22–5.81)
RDW: 13.8 % (ref 11.5–15.5)
WBC: 5.6 10*3/uL (ref 4.0–10.5)

## 2017-01-04 LAB — LIPID PANEL
Cholesterol: 132 mg/dL (ref 0–200)
HDL: 43.5 mg/dL (ref >39.00)
LDL Cholesterol: 69 mg/dL (ref 0–99)
NonHDL: 88.1
Total CHOL/HDL Ratio: 3
Triglycerides: 97 mg/dL (ref 0.0–149.0)
VLDL: 19.4 mg/dL (ref 0.0–40.0)

## 2017-01-04 LAB — HEPATIC FUNCTION PANEL
ALT: 17 U/L (ref 0–53)
AST: 17 U/L (ref 0–37)
Albumin: 3.7 g/dL (ref 3.5–5.2)
Alkaline Phosphatase: 53 U/L (ref 39–117)
Bilirubin, Direct: 0.1 mg/dL (ref 0.0–0.3)
Total Bilirubin: 0.7 mg/dL (ref 0.2–1.2)
Total Protein: 5.8 g/dL — ABNORMAL LOW (ref 6.0–8.3)

## 2017-01-04 LAB — BASIC METABOLIC PANEL
BUN: 14 mg/dL (ref 6–23)
CO2: 30 mEq/L (ref 19–32)
Calcium: 8.7 mg/dL (ref 8.4–10.5)
Chloride: 106 mEq/L (ref 96–112)
Creatinine, Ser: 0.84 mg/dL (ref 0.40–1.50)
GFR: 95.67 mL/min (ref >60.00)
Glucose, Bld: 96 mg/dL (ref 70–99)
Potassium: 4.2 mEq/L (ref 3.5–5.1)
Sodium: 142 mEq/L (ref 135–145)

## 2017-01-04 LAB — HEMOGLOBIN A1C: Hgb A1c MFr Bld: 6 % (ref 4.6–6.5)

## 2017-01-22 ENCOUNTER — Other Ambulatory Visit: Payer: Self-pay | Admitting: Internal Medicine

## 2017-02-22 ENCOUNTER — Encounter: Payer: Self-pay | Admitting: Internal Medicine

## 2017-04-18 ENCOUNTER — Telehealth: Payer: Self-pay | Admitting: Internal Medicine

## 2017-04-18 MED ORDER — SILDENAFIL CITRATE 50 MG PO TABS: 50.0000 mg | ORAL_TABLET | ORAL | 3 refills | Status: DC | PRN

## 2017-04-18 NOTE — Telephone Encounter (Signed)
Rx refill

## 2017-05-18 ENCOUNTER — Other Ambulatory Visit: Payer: Self-pay | Admitting: Internal Medicine

## 2017-07-17 ENCOUNTER — Other Ambulatory Visit: Payer: Self-pay | Admitting: Internal Medicine

## 2017-08-16 LAB — PSA: PSA: 0.29

## 2017-08-30 ENCOUNTER — Encounter: Payer: Self-pay | Admitting: Internal Medicine

## 2017-11-12 ENCOUNTER — Other Ambulatory Visit: Payer: Self-pay | Admitting: Internal Medicine

## 2018-01-07 ENCOUNTER — Other Ambulatory Visit: Payer: Self-pay | Admitting: Internal Medicine

## 2018-02-04 ENCOUNTER — Other Ambulatory Visit: Payer: Self-pay | Admitting: Internal Medicine

## 2018-03-03 ENCOUNTER — Other Ambulatory Visit: Payer: Self-pay | Admitting: Internal Medicine

## 2018-03-20 ENCOUNTER — Other Ambulatory Visit: Payer: Self-pay | Admitting: Internal Medicine

## 2018-03-20 NOTE — Telephone Encounter (Signed)
Copied from [place] [id number]. Topic: Quick Communication - Rx Refill/Question >> Mar 20, 2018 10:03 AM Yvette Rack wrote: Medication: lovastatin (MEVACOR) 40 MG tablet  Has the patient contacted their pharmacy? no  Preferred Pharmacy (with phone number or street name): [id number] - [place], [place] [phone] (Phone) [phone] (Fax)  Agent: Please be advised that RX refills may take up to 3 business days. We ask that you follow-up with your pharmacy.

## 2018-03-20 NOTE — Telephone Encounter (Signed)
Requested medication (s) are due for refill today:   Yes  Requested medication (s) are on the active medication list:   Yes  Future visit scheduled:   No   Message left to call and make an appt for a CPE with Dr. Regis Bill   Last ordered: 02/04/18  #30  0 refills.    30 day refill been done on 02/04/18.   Requested Prescriptions  Pending Prescriptions Disp Refills   lovastatin (MEVACOR) 40 MG tablet 30 tablet 0     Cardiovascular:  Antilipid - Statins Failed - 03/20/2018 10:20 AM      Failed - Total Cholesterol in normal range and within 360 days    Cholesterol  Date Value Ref Range Status  01/04/2017 132 0 - 200 mg/dL Final    Comment:    ATP III Classification       Desirable:  < 200 mg/dL               Borderline High:  200 - 239 mg/dL          High:  > = 240 mg/dL         Failed - LDL in normal range and within 360 days    LDL Cholesterol  Date Value Ref Range Status  01/04/2017 69 0 - 99 mg/dL Final         Failed - HDL in normal range and within 360 days    HDL  Date Value Ref Range Status  01/04/2017 43.50 >39.00 mg/dL Final         Failed - Triglycerides in normal range and within 360 days    Triglycerides  Date Value Ref Range Status  01/04/2017 97.0 0.0 - 149.0 mg/dL Final    Comment:    Normal:  <150 mg/dLBorderline High:  150 - 199 mg/dL         Failed - Valid encounter within last 12 months    Recent Outpatient Visits          1 year ago Visit for preventive health examination   [facility], Standley Brooking, MD   2 years ago Essential hypertension   [facility], Standley Brooking, MD   2 years ago Visit for preventive health examination   [place] [facility], Standley Brooking, MD   3 years ago Visit for preventive health examination   [place] [facility], Standley Brooking, MD   4 years ago Visit for preventive health examination   [place] [facility], Standley Brooking, MD              Passed - Patient is not pregnant

## 2018-03-21 MED ORDER — LOVASTATIN 40 MG PO TABS: ORAL_TABLET | ORAL | 0 refills | Status: DC

## 2018-04-16 NOTE — Patient Instructions (Addendum)
Check into cologuard   For colon cancer screening and we can put order in for this .   considier   Lung cancer screening and we can do referral .   make injection nurse appt for shingrix when you are ready .   Will notify you  of labs when available.    Health Maintenance, Male A healthy lifestyle and preventive care is important for your health and wellness. Ask your health care provider about what schedule of regular examinations is right for you. What should I know about weight and diet? Eat a Healthy Diet  Eat plenty of vegetables, fruits, whole grains, low-fat dairy products, and lean protein.  Do not eat a lot of foods high in solid fats, added sugars, or salt.  Maintain a Healthy Weight Regular exercise can help you achieve or maintain a healthy weight. You should:  Do at least 150 minutes of exercise each week. The exercise should increase your heart rate and make you sweat (moderate-intensity exercise).  Do strength-training exercises at least twice a week.  Watch Your Levels of Cholesterol and Blood Lipids  Have your blood tested for lipids and cholesterol every 5 years starting at 72 years of age. If you are at high risk for heart disease, you should start having your blood tested when you are 72 years old. You may need to have your cholesterol levels checked more often if: ? Your lipid or cholesterol levels are high. ? You are older than 72 years of age. ? You are at high risk for heart disease.  What should I know about cancer screening? Many types of cancers can be detected early and may often be prevented. Lung Cancer  You should be screened every year for lung cancer if: ? You are a current smoker who has smoked for at least 30 years. ? You are a former smoker who has quit within the past 15 years.  Talk to your health care provider about your screening options, when you should start screening, and how often you should be screened.  Colorectal  Cancer  Routine colorectal cancer screening usually begins at 72 years of age and should be repeated every 5-10 years until you are 72 years old. You may need to be screened more often if early forms of precancerous polyps or small growths are found. Your health care provider may recommend screening at an earlier age if you have risk factors for colon cancer.  Your health care provider may recommend using home test kits to check for hidden blood in the stool.  A small camera at the end of a tube can be used to examine your colon (sigmoidoscopy or colonoscopy). This checks for the earliest forms of colorectal cancer.  Prostate and Testicular Cancer  Depending on your age and overall health, your health care provider may do certain tests to screen for prostate and testicular cancer.  Talk to your health care provider about any symptoms or concerns you have about testicular or prostate cancer.  Skin Cancer  Check your skin from head to toe regularly.  Tell your health care provider about any new moles or changes in moles, especially if: ? There is a change in a mole's size, shape, or color. ? You have a mole that is larger than a pencil eraser.  Always use sunscreen. Apply sunscreen liberally and repeat throughout the day.  Protect yourself by wearing long sleeves, pants, a wide-brimmed hat, and sunglasses when outside.  What should I know about  heart disease, diabetes, and high blood pressure?  If you are 18-4 years of age, have your blood pressure checked every 3-5 years. If you are 58 years of age or older, have your blood pressure checked every year. You should have your blood pressure measured twice-once when you are at a hospital or clinic, and once when you are not at a hospital or clinic. Record the average of the two measurements. To check your blood pressure when you are not at a hospital or clinic, you can use: ? An automated blood pressure machine at a pharmacy. ? A home blood  pressure monitor.  Talk to your health care provider about your target blood pressure.  If you are between 51-5 years old, ask your health care provider if you should take aspirin to prevent heart disease.  Have regular diabetes screenings by checking your fasting blood sugar level. ? If you are at a normal weight and have a low risk for diabetes, have this test once every three years after the age of 33. ? If you are overweight and have a high risk for diabetes, consider being tested at a younger age or more often.  A one-time screening for abdominal aortic aneurysm (AAA) by ultrasound is recommended for men aged 79-75 years who are current or former smokers. What should I know about preventing infection? Hepatitis B If you have a higher risk for hepatitis B, you should be screened for this virus. Talk with your health care provider to find out if you are at risk for hepatitis B infection. Hepatitis C Blood testing is recommended for:  Everyone born from [birth year] through 1965.  Anyone with known risk factors for hepatitis C.  Sexually Transmitted Diseases (STDs)  You should be screened each year for STDs including gonorrhea and chlamydia if: ? You are sexually active and are younger than 72 years of age. ? You are older than 72 years of age and your health care provider tells you that you are at risk for this type of infection. ? Your sexual activity has changed since you were last screened and you are at an increased risk for chlamydia or gonorrhea. Ask your health care provider if you are at risk.  Talk with your health care provider about whether you are at high risk of being infected with HIV. Your health care provider may recommend a prescription medicine to help prevent HIV infection.  What else can I do?  Schedule regular health, dental, and eye exams.  Stay current with your vaccines (immunizations).  Do not use any tobacco products, such as cigarettes, chewing tobacco, and  e-cigarettes. If you need help quitting, ask your health care provider.  Limit alcohol intake to no more than 2 drinks per day. One drink equals 12 ounces of beer, 5 ounces of wine, or 1 ounces of hard liquor.  Do not use street drugs.  Do not share needles.  Ask your health care provider for help if you need support or information about quitting drugs.  Tell your health care provider if you often feel depressed.  Tell your health care provider if you have ever been abused or do not feel safe at home. This information is not intended to replace advice given to you by your health care provider. Make sure you discuss any questions you have with your health care provider. Document Released: 11/17/2007 Document Revised: 01/18/2016 Document Reviewed: 02/22/2015 Elsevier Interactive Patient Education  [employer].

## 2018-04-16 NOTE — Progress Notes (Signed)
Chief Complaint  Patient presents with  . Annual Exam    No new concerns    HPI: Patient  Brian Rodriguez  72 y.o. comes in today for Preventive Health Care visit  And Chronic disease management   Prostate following urology  BP on meds no se  LIPIDS no se of med  Tobacco cont 1ppd  Denies sx   Not plannign on quitting at this tmie  Still no colon screening  Neg fam hx   Health Maintenance  Topic Date Due  . Hepatitis C Screening  1945-09-20  . COLONOSCOPY  06/06/2013  . TETANUS/TDAP  04/26/2020  . INFLUENZA VACCINE  Completed  . PNA vac Low Risk Adult  Completed   Health Maintenance Review LIFESTYLE:  Exercise:   Working  Tobacco/ETS: havent changes  Pack a day .  Alcohol:   Less than 7 per week  Sugar beverages: no Sleep: 6  - 7  Drug use: no HH of 2  Pets 1  Work: about  [address]   88 every 2 weeks   ROS:  GEN/ HEENT: No fever, significant weight changes sweats headaches vision problems hearing changes, CV/ PULM; No chest pain shortness of breath cough, syncope,edema  change in exercise tolerance. GI /GU: No adominal pain, vomiting, change in bowel habits. No blood in the stool.  SKIN/HEME: ,no acute skin rashes suspicious lesions or bleeding. No lymphadenopathy, nodules, masses.  NEURO/ PSYCH:  No neurologic signs such as weakness numbness. No depression anxiety. IMM/ Allergy: No unusual infections.  Allergy .   REST of 12 system review negative except as per HPI   Past Medical History:  Diagnosis Date  . ED (erectile dysfunction)   . H/O echocardiogram 2/10   ef 55 -60 mildly calcific aortic valve  . Headache(784.0)   . Hyperlipidemia   . Hypertension   . Osteopenia   . Prostate cancer ([place])   . Syncope   . Wears glasses     Past Surgical History:  Procedure Laterality Date  . COLONOSCOPY    . PROSTATE BIOPSY    . RADIOACTIVE SEED IMPLANT N/A 05/11/2016   Procedure: RADIOACTIVE SEED IMPLANT/BRACHYTHERAPY IMPLANT;  Surgeon: Alexis Frock, MD;   Location: [facility];  Service: Urology;  Laterality: N/A;    Family History  Problem Relation Age of Onset  . Cancer Father        Prostate  . Cancer Brother        Prostate  . Parkinsonism Mother        deceased 10s   . Cancer Sister        abdominal? gi cancer?     Social History   Socioeconomic History  . Marital status: Married    Spouse name: April Travieso  . Number of children: 4  . Years of education: Not on file  . Highest education level: Not on file  Occupational History  . Not on file  Social Needs  . Financial resource strain: Not on file  . Food insecurity:    Worry: Not on file    Inability: Not on file  . Transportation needs:    Medical: Not on file    Non-medical: Not on file  Tobacco Use  . Smoking status: Current Every Day Smoker    Packs/day: 1.00    Years: 35.00    Pack years: 35.00    Types: Cigarettes  . Smokeless tobacco: Never Used  Substance and Sexual Activity  . Alcohol use:  Yes    Alcohol/week: 4.0 standard drinks    Types: 4 Glasses of wine per week  . Drug use: No  . Sexual activity: Yes    Comment: uses sildenafil 50 mg prn with satisfaction  Lifestyle  . Physical activity:    Days per week: Not on file    Minutes per session: Not on file  . Stress: Not on file  Relationships  . Social connections:    Talks on phone: Not on file    Gets together: Not on file    Attends religious service: Not on file    Active member of club or organization: Not on file    Attends meetings of clubs or organizations: Not on file    Relationship status: Not on file  Other Topics Concern  . Not on file  Social History Narrative   HH of 3  Now for 10 years    Married  Divorced x 2    Works [employer]  52- 12 per week 12 hour shifts   1ppd tobacco    1 dog    Outpatient Medications Prior to Visit  Medication Sig Dispense Refill  . aspirin 325 MG tablet Take 325 mg by mouth daily as needed for mild pain or headache.     .  losartan (COZAAR) 25 MG tablet TAKE 1 TABLET BY MOUTH DAILY 30 tablet 5  . lovastatin (MEVACOR) 40 MG tablet TAKE 1 TABLET BY MOUTH EVERYDAY AT BEDTIME 30 tablet 0  . lovastatin (MEVACOR) 40 MG tablet TAKE 1 TABLET BY MOUTH EVERYDAY AT BEDTIME 30 tablet 0  . Multiple Vitamin (MULTIVITAMIN) tablet Take 1 tablet by mouth daily.    Marland Kitchen senna-docusate (SENOKOT-S) 8.6-50 MG tablet Take 1 tablet by mouth daily. While taking strong pain meds to prevent constipation. 10 tablet 0  . sildenafil (VIAGRA) 50 MG tablet Take 1 tablet (50 mg total) as needed by mouth for erectile dysfunction. 18 tablet 3  . tamsulosin (FLOMAX) 0.4 MG CAPS capsule Take 1 capsule (0.4 mg total) by mouth daily. As needed for urinary frequency / urgency 30 capsule 3   No facility-administered medications prior to visit.      EXAM:  BP 102/62 (BP Location: Right Arm, Patient Position: Sitting, Cuff Size: Normal)   Pulse 68   Temp 97.6 F (36.4 C) (Oral)   Ht 5' 3.39" (1.61 m)   Wt 137 lb 6.4 oz (62.3 kg)   BMI 24.04 kg/m   Body mass index is 24.04 kg/m. Wt Readings from Last 3 Encounters:  04/17/18 137 lb 6.4 oz (62.3 kg)  01/01/17 141 lb 12.8 oz (64.3 kg)  05/14/16 143 lb (64.9 kg)    Physical Exam: Vital signs reviewed HBZ:JIRC is a well-developed well-nourished alert cooperative    who appearsr stated age in no acute distress.  HEENT: normocephalic atraumatic , Eyes: PERRL EOM's full, conjunctiva clear, glasses  Nares: paten,t no deformity discharge or tenderness., Ears: no deformity EAC's clear TMs with normal landmarks. Mouth: clear OP, no lesions, edema.  Moist mucous membranes. Dentition in adequate repair. NECK: supple without masses, thyromegaly or bruits. CHEST/PULM:  Clear to auscultation and percussion breath sounds equal no wheeze , rales or rhonchi.CV: PMI is nondisplaced, S1 S2 no gallops, murmurs, rubs. Peripheral pulses are full without delay.No JVD .  ABDOMEN: Bowel sounds normal nontender  No guard  or rebound, no hepato splenomegal no CVA tenderness.   Extremtities:  No clubbing cyanosis or edema, no acute joint swelling or redness  no focal atrophy NEURO:  Oriented x3, cranial nerves 3-12 appear to be intact, no obvious focal weakness,gait within normal limits no abnormal reflexes or asymmetrical SKIN: No acute rashes normal turgor, color, no bruising or petechiae. PSYCH: Oriented, good eye contact, no obvious depression anxiety, cognition and judgment appear normal. LN: no cervical axillary inguinal adenopathy    BP Readings from Last 3 Encounters:  04/17/18 102/62  01/01/17 104/76  06/08/16 110/63    Lab results Wt Readings from Last 3 Encounters:  04/17/18 137 lb 6.4 oz (62.3 kg)  01/01/17 141 lb 12.8 oz (64.3 kg)  05/14/16 143 lb (64.9 kg)     ASSESSMENT AND PLAN:  Discussed the following assessment and plan:  Visit for preventive health examination - Plan: Basic metabolic panel, CBC with Differential/Platelet, Hepatic function panel, Lipid panel, Hemoglobin A1c  Medication management - Plan: Basic metabolic panel, CBC with Differential/Platelet, Hepatic function panel, Lipid panel, Hemoglobin A1c  Essential hypertension - Plan: Basic metabolic panel, CBC with Differential/Platelet, Hepatic function panel, Lipid panel, Hemoglobin A1c  TOBACCO USE  Malignant neoplasm of prostate (HCC)  Hyperlipidemia, unspecified hyperlipidemia type - Plan: Basic metabolic panel, CBC with Differential/Platelet, Hepatic function panel, Lipid panel, Hemoglobin A1c Disc  screenings  Reluctant but ha on cologuard    And also lung cancer screening  Lab today  Advise dc tobacco   Monitoring lab today and continue medications . Patient Care Team: Burnis Medin, MD as PCP - General Patient Instructions  Check into cologuard   For colon cancer screening and we can put order in for this .   considier   Lung cancer screening and we can do referral .   make injection nurse appt for  shingrix when you are ready .   Will notify you  of labs when available.    Health Maintenance, Male A healthy lifestyle and preventive care is important for your health and wellness. Ask your health care provider about what schedule of regular examinations is right for you. What should I know about weight and diet? Eat a Healthy Diet  Eat plenty of vegetables, fruits, whole grains, low-fat dairy products, and lean protein.  Do not eat a lot of foods high in solid fats, added sugars, or salt.  Maintain a Healthy Weight Regular exercise can help you achieve or maintain a healthy weight. You should:  Do at least 150 minutes of exercise each week. The exercise should increase your heart rate and make you sweat (moderate-intensity exercise).  Do strength-training exercises at least twice a week.  Watch Your Levels of Cholesterol and Blood Lipids  Have your blood tested for lipids and cholesterol every 5 years starting at 72 years of age. If you are at high risk for heart disease, you should start having your blood tested when you are 72 years old. You may need to have your cholesterol levels checked more often if: ? Your lipid or cholesterol levels are high. ? You are older than 72 years of age. ? You are at high risk for heart disease.  What should I know about cancer screening? Many types of cancers can be detected early and may often be prevented. Lung Cancer  You should be screened every year for lung cancer if: ? You are a current smoker who has smoked for at least 30 years. ? You are a former smoker who has quit within the past 15 years.  Talk to your health care provider about your screening options, when you should  start screening, and how often you should be screened.  Colorectal Cancer  Routine colorectal cancer screening usually begins at 72 years of age and should be repeated every 5-10 years until you are 72 years old. You may need to be screened more often if early  forms of precancerous polyps or small growths are found. Your health care provider may recommend screening at an earlier age if you have risk factors for colon cancer.  Your health care provider may recommend using home test kits to check for hidden blood in the stool.  A small camera at the end of a tube can be used to examine your colon (sigmoidoscopy or colonoscopy). This checks for the earliest forms of colorectal cancer.  Prostate and Testicular Cancer  Depending on your age and overall health, your health care provider may do certain tests to screen for prostate and testicular cancer.  Talk to your health care provider about any symptoms or concerns you have about testicular or prostate cancer.  Skin Cancer  Check your skin from head to toe regularly.  Tell your health care provider about any new moles or changes in moles, especially if: ? There is a change in a mole's size, shape, or color. ? You have a mole that is larger than a pencil eraser.  Always use sunscreen. Apply sunscreen liberally and repeat throughout the day.  Protect yourself by wearing long sleeves, pants, a wide-brimmed hat, and sunglasses when outside.  What should I know about heart disease, diabetes, and high blood pressure?  If you are 31-50 years of age, have your blood pressure checked every 3-5 years. If you are 61 years of age or older, have your blood pressure checked every year. You should have your blood pressure measured twice-once when you are at a hospital or clinic, and once when you are not at a hospital or clinic. Record the average of the two measurements. To check your blood pressure when you are not at a hospital or clinic, you can use: ? An automated blood pressure machine at a pharmacy. ? A home blood pressure monitor.  Talk to your health care provider about your target blood pressure.  If you are between 73-55 years old, ask your health care provider if you should take aspirin to prevent  heart disease.  Have regular diabetes screenings by checking your fasting blood sugar level. ? If you are at a normal weight and have a low risk for diabetes, have this test once every three years after the age of 15. ? If you are overweight and have a high risk for diabetes, consider being tested at a younger age or more often.  A one-time screening for abdominal aortic aneurysm (AAA) by ultrasound is recommended for men aged 6-75 years who are current or former smokers. What should I know about preventing infection? Hepatitis B If you have a higher risk for hepatitis B, you should be screened for this virus. Talk with your health care provider to find out if you are at risk for hepatitis B infection. Hepatitis C Blood testing is recommended for:  Everyone born from [birth year] through 1965.  Anyone with known risk factors for hepatitis C.  Sexually Transmitted Diseases (STDs)  You should be screened each year for STDs including gonorrhea and chlamydia if: ? You are sexually active and are younger than 72 years of age. ? You are older than 72 years of age and your health care provider tells you that you are at risk  for this type of infection. ? Your sexual activity has changed since you were last screened and you are at an increased risk for chlamydia or gonorrhea. Ask your health care provider if you are at risk.  Talk with your health care provider about whether you are at high risk of being infected with HIV. Your health care provider may recommend a prescription medicine to help prevent HIV infection.  What else can I do?  Schedule regular health, dental, and eye exams.  Stay current with your vaccines (immunizations).  Do not use any tobacco products, such as cigarettes, chewing tobacco, and e-cigarettes. If you need help quitting, ask your health care provider.  Limit alcohol intake to no more than 2 drinks per day. One drink equals 12 ounces of beer, 5 ounces of wine, or 1 ounces  of hard liquor.  Do not use street drugs.  Do not share needles.  Ask your health care provider for help if you need support or information about quitting drugs.  Tell your health care provider if you often feel depressed.  Tell your health care provider if you have ever been abused or do not feel safe at home. This information is not intended to replace advice given to you by your health care provider. Make sure you discuss any questions you have with your health care provider. Document Released: 11/17/2007 Document Revised: 01/18/2016 Document Reviewed: 02/22/2015 Elsevier Interactive Patient Education  2018 [place]. Panosh M.D.

## 2018-04-17 ENCOUNTER — Encounter: Payer: Self-pay | Admitting: Internal Medicine

## 2018-04-17 ENCOUNTER — Ambulatory Visit (INDEPENDENT_AMBULATORY_CARE_PROVIDER_SITE_OTHER): Payer: 59 | Admitting: Internal Medicine

## 2018-04-17 VITALS — BP 102/62 | HR 68 | Temp 97.6°F | Ht 63.39 in | Wt 137.4 lb

## 2018-04-17 DIAGNOSIS — C61 Malignant neoplasm of prostate: Secondary | ICD-10-CM

## 2018-04-17 DIAGNOSIS — E785 Hyperlipidemia, unspecified: Secondary | ICD-10-CM | POA: Diagnosis not present

## 2018-04-17 DIAGNOSIS — Z79899 Other long term (current) drug therapy: Secondary | ICD-10-CM | POA: Diagnosis not present

## 2018-04-17 DIAGNOSIS — Z0000 Encounter for general adult medical examination without abnormal findings: Secondary | ICD-10-CM

## 2018-04-17 DIAGNOSIS — I10 Essential (primary) hypertension: Secondary | ICD-10-CM | POA: Diagnosis not present

## 2018-04-17 DIAGNOSIS — F17200 Nicotine dependence, unspecified, uncomplicated: Secondary | ICD-10-CM | POA: Diagnosis not present

## 2018-04-17 LAB — HEPATIC FUNCTION PANEL
ALT: 23 U/L (ref 0–53)
AST: 22 U/L (ref 0–37)
Albumin: 4.3 g/dL (ref 3.5–5.2)
Alkaline Phosphatase: 60 U/L (ref 39–117)
Bilirubin, Direct: 0.1 mg/dL (ref 0.0–0.3)
Total Bilirubin: 0.7 mg/dL (ref 0.2–1.2)
Total Protein: 6.7 g/dL (ref 6.0–8.3)

## 2018-04-17 LAB — CBC WITH DIFFERENTIAL/PLATELET
Basophils Absolute: 0 10*3/uL (ref 0.0–0.1)
Basophils Relative: 0.6 % (ref 0.0–3.0)
Eosinophils Absolute: 0.2 10*3/uL (ref 0.0–0.7)
Eosinophils Relative: 2.6 % (ref 0.0–5.0)
HCT: 45 % (ref 39.0–52.0)
Hemoglobin: 15.1 g/dL (ref 13.0–17.0)
Lymphocytes Relative: 20 % (ref 12.0–46.0)
Lymphs Abs: 1.6 10*3/uL (ref 0.7–4.0)
MCHC: 33.5 g/dL (ref 30.0–36.0)
MCV: 95.8 fl (ref 78.0–100.0)
Monocytes Absolute: 0.9 10*3/uL (ref 0.1–1.0)
Monocytes Relative: 11.5 % (ref 3.0–12.0)
Neutro Abs: 5.3 10*3/uL (ref 1.4–7.7)
Neutrophils Relative %: 65.3 % (ref 43.0–77.0)
Platelets: 337 10*3/uL (ref 150.0–400.0)
RBC: 4.7 Mil/uL (ref 4.22–5.81)
RDW: 14 % (ref 11.5–15.5)
WBC: 8.1 10*3/uL (ref 4.0–10.5)

## 2018-04-17 LAB — LIPID PANEL
Cholesterol: 144 mg/dL (ref 0–200)
HDL: 61.2 mg/dL (ref >39.00)
LDL Cholesterol: 68 mg/dL (ref 0–99)
NonHDL: 82.63
Total CHOL/HDL Ratio: 2
Triglycerides: 71 mg/dL (ref 0.0–149.0)
VLDL: 14.2 mg/dL (ref 0.0–40.0)

## 2018-04-17 LAB — BASIC METABOLIC PANEL
BUN: 16 mg/dL (ref 6–23)
CO2: 29 mEq/L (ref 19–32)
Calcium: 9.2 mg/dL (ref 8.4–10.5)
Chloride: 105 mEq/L (ref 96–112)
Creatinine, Ser: 0.85 mg/dL (ref 0.40–1.50)
GFR: 94.03 mL/min (ref >60.00)
Glucose, Bld: 96 mg/dL (ref 70–99)
Potassium: 4.2 mEq/L (ref 3.5–5.1)
Sodium: 141 mEq/L (ref 135–145)

## 2018-04-17 LAB — HEMOGLOBIN A1C: Hgb A1c MFr Bld: 6 % (ref 4.6–6.5)

## 2018-04-22 ENCOUNTER — Other Ambulatory Visit: Payer: Self-pay | Admitting: Internal Medicine

## 2018-04-22 NOTE — Telephone Encounter (Signed)
Copied from [place] [id number]. Topic: Quick Communication - Rx Refill/Question >> Apr 22, 2018  4:13 PM Bea Graff, NT wrote: Medication: lovastatin (MEVACOR) 40 MG tablet   Has the patient contacted their pharmacy? Yes.   (Agent: If no, request that the patient contact the pharmacy for the refill.) (Agent: If yes, when and what did the pharmacy advise?)  Preferred Pharmacy (with phone number or street name): [id number] - [place], [place] - [address] [phone] (Phone) [phone] (Fax)    Agent: Please be advised that RX refills may take up to 3 business days. We ask that you follow-up with your pharmacy.

## 2018-04-23 MED ORDER — LOVASTATIN 40 MG PO TABS: ORAL_TABLET | ORAL | 5 refills | Status: DC

## 2018-04-25 LAB — PSA

## 2018-05-10 ENCOUNTER — Other Ambulatory Visit: Payer: Self-pay | Admitting: Internal Medicine

## 2018-05-18 ENCOUNTER — Other Ambulatory Visit: Payer: Self-pay | Admitting: Internal Medicine

## 2018-06-03 IMAGING — DX DG CHEST 2V
2 series · 2 of 2 positions shown · non-contrast
Comparison: Portable chest x-ray of 06/27/2008

CLINICAL DATA: Preop, smoking history

EXAM:
CHEST  2 VIEW

[chest pa]
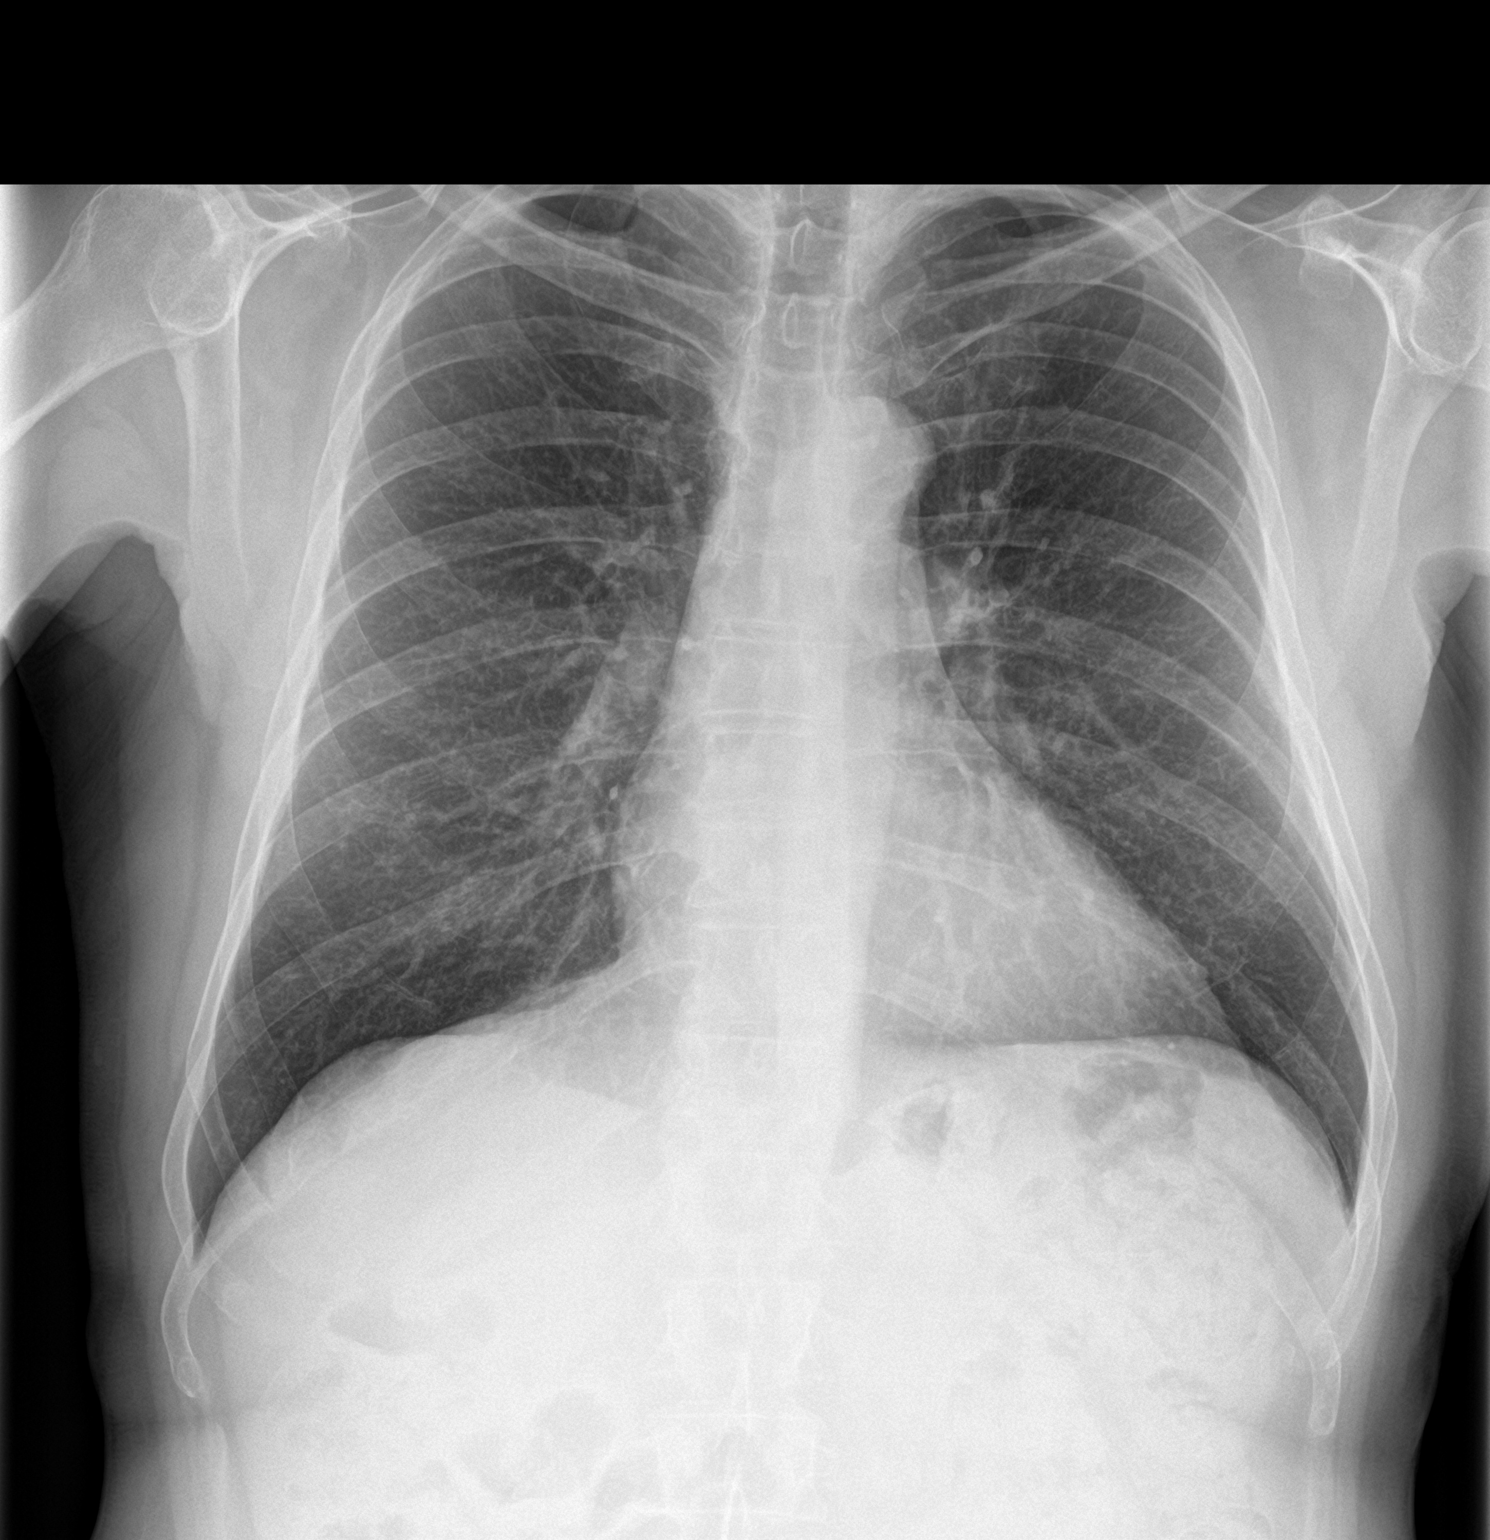

[chest lat]
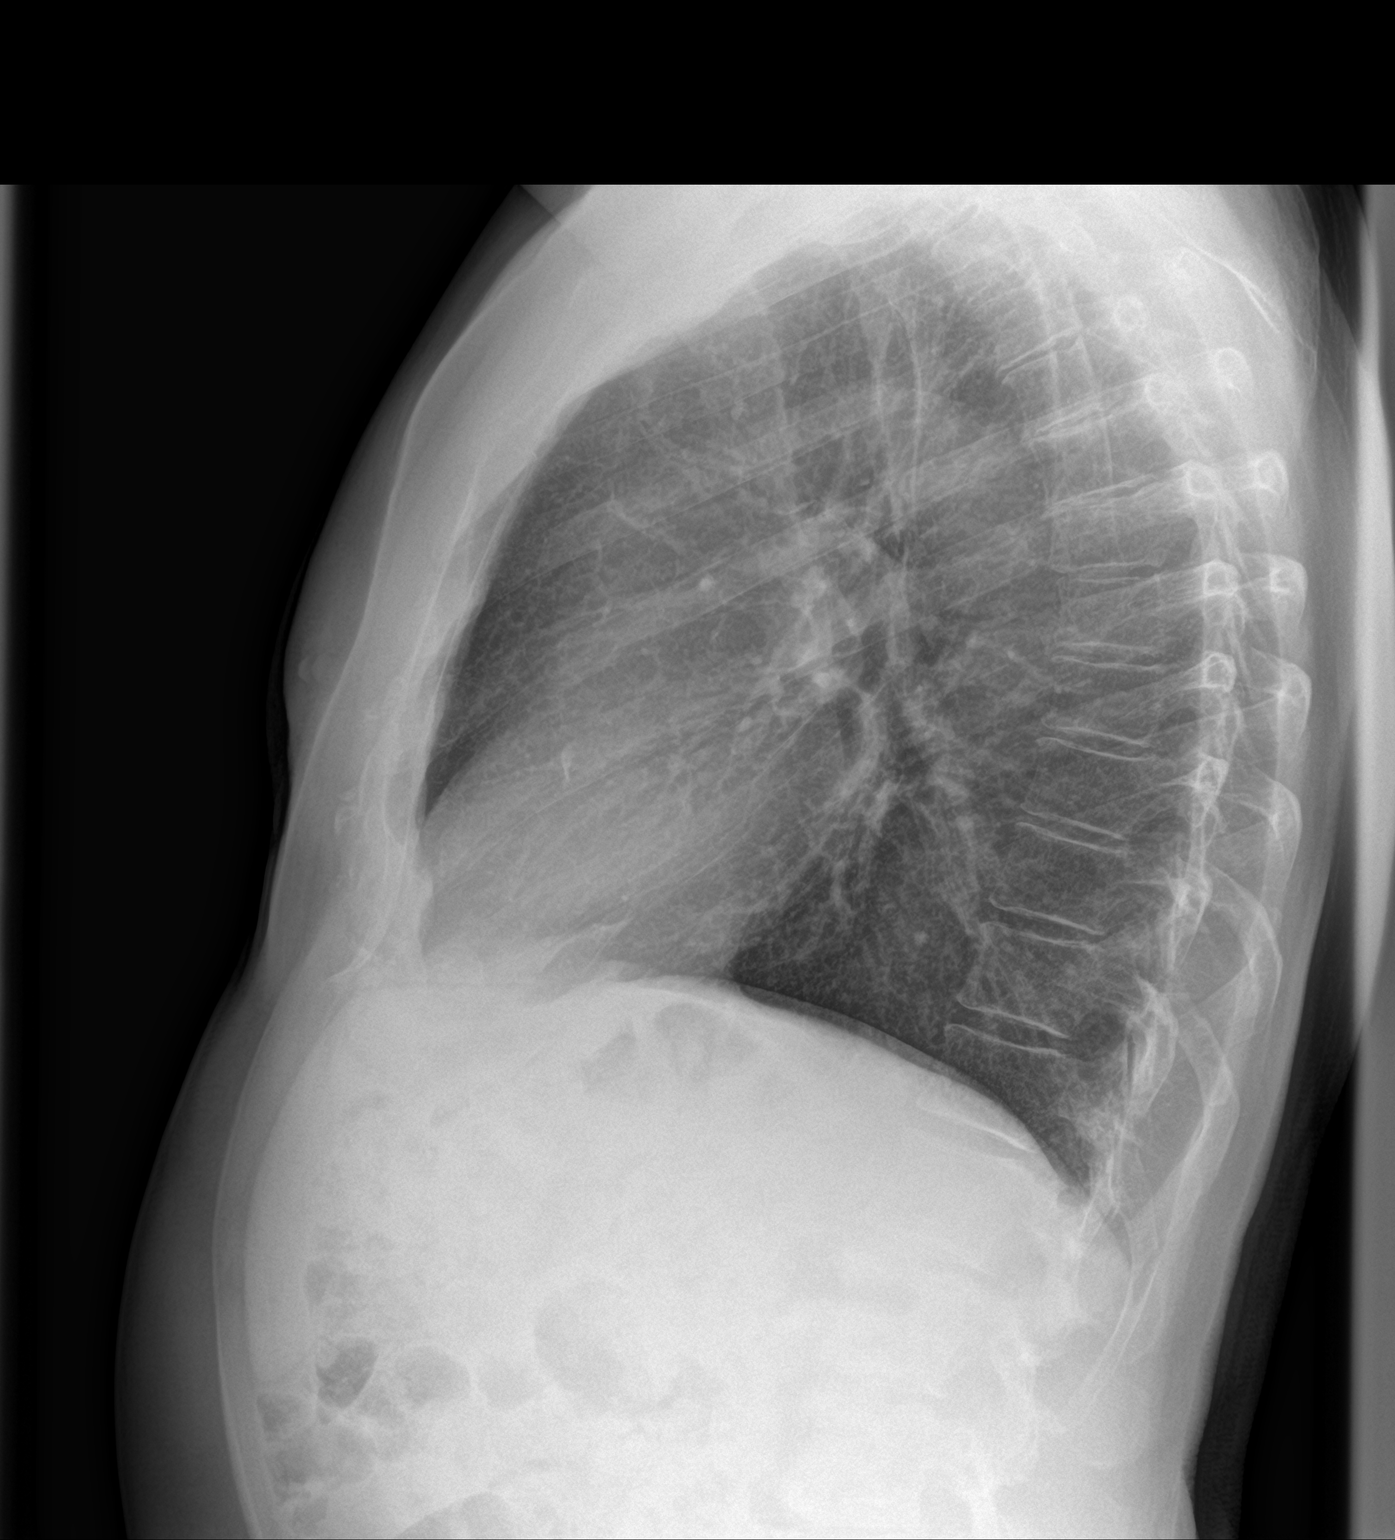

[2 of 2 positions shown; findings below may reference images not displayed]

FINDINGS: No active infiltrate or effusion is seen. Mediastinal and hilar
contours are unremarkable. The heart is within normal limits in
size. No bony abnormality is seen.
IMPRESSION: No active cardiopulmonary disease.

## 2018-06-18 ENCOUNTER — Encounter: Payer: Self-pay | Admitting: Internal Medicine

## 2018-11-05 ENCOUNTER — Other Ambulatory Visit: Payer: Self-pay | Admitting: Internal Medicine

## 2018-11-09 ENCOUNTER — Other Ambulatory Visit: Payer: Self-pay | Admitting: Internal Medicine

## 2019-05-07 ENCOUNTER — Other Ambulatory Visit: Payer: Self-pay | Admitting: Internal Medicine

## 2019-05-12 NOTE — Progress Notes (Signed)
Virtual Visit via Video Note  I connected with@ on 12 09 20 at  9:30 AM EST by a video enabled telemedicine application and verified that I am speaking with the correct person using two identifiers. Location patient: home Location provider:work  office Persons participating in the virtual visit: patient, provider  WIth national recommendations  regarding COVID 19 pandemic   video visit is advised over in office visit for this patient.  Patient aware  of the limitations of evaluation and management by telemedicine and  availability of in person appointments. and agreed to proceed.   HPI: Brian Rodriguez presents for video visit for yearly check  Chronic disease management BP:  Doing well onlow dose losartan no se in range bp  LIPID taking lovastatin Prosatate cancer  Seeing urologist next week  Followed . 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  Did get flu vaccine and first  shingrix at [facility]: See pertinent positives and negatives per HPI. No breathing cv sx of report    Past Medical History:  Diagnosis Date  . ED (erectile dysfunction)   . H/O echocardiogram 2/10   ef 50 -60 mildly calcific aortic valve  . Headache(784.0)   . Hyperlipidemia   . Hypertension   . Osteopenia   . Prostate cancer ([place])   . Syncope   . Wears glasses     Past Surgical History:  Procedure Laterality Date  . COLONOSCOPY    . PROSTATE BIOPSY    . RADIOACTIVE SEED IMPLANT N/A 05/11/2016   Procedure: RADIOACTIVE SEED IMPLANT/BRACHYTHERAPY IMPLANT;  Surgeon: Alexis Frock, MD;  Location: [facility];  Service: Urology;  Laterality: N/A;    Family History  Problem Relation Age of Onset  . Cancer Father        Prostate  . Cancer Brother        Prostate  . Parkinsonism Mother        deceased 21s   . Cancer Sister        abdominal? gi cancer?     Social  History   Tobacco Use  . Smoking status: Current Every Day Smoker    Packs/day: 1.00    Years: 35.00    Pack years: 35.00    Types: Cigarettes  . Smokeless tobacco: Never Used  Substance Use Topics  . Alcohol use: Yes    Alcohol/week: 4.0 standard drinks    Types: 4 Glasses of wine per week  . Drug use: No      Current Outpatient Medications:  .  aspirin 325 MG tablet, Take 325 mg by mouth daily as needed for mild pain or headache. , Disp: , Rfl:  .  losartan (COZAAR) 25 MG tablet, TAKE 1 TABLET BY MOUTH EVERY DAY, Disp: 30 tablet, Rfl: 0 .  lovastatin (MEVACOR) 40 MG tablet, TAKE 1 TABLET BY MOUTH EVERYDAY AT BEDTIME, Disp: 30 tablet, Rfl: 0 .  Multiple Vitamin (MULTIVITAMIN) tablet, Take 1 tablet by mouth daily., Disp: , Rfl:  .  senna-docusate (SENOKOT-S) 8.6-50 MG tablet, Take 1 tablet by mouth daily. While taking strong pain meds to prevent constipation., Disp: 10 tablet, Rfl: 0 .  sildenafil (VIAGRA) 50 MG tablet, TAKE 1 TABLET (50 MG TOTAL) AS NEEDED BY MOUTH FOR ERECTILE DYSFUNCTION., Disp: 18 tablet, Rfl: 3 .  tamsulosin (FLOMAX) 0.4 MG CAPS capsule, Take 1 capsule (0.4  mg total) by mouth daily. As needed for urinary frequency / urgency, Disp: 30 capsule, Rfl: 3  EXAM: BP Readings from Last 3 Encounters:  04/17/18 102/62  01/01/17 104/76  06/08/16 110/63    VITALS per patient if applicable: bp [id number] p 71 weight between 136 and 141   GENERAL: alert, oriented, appears well and in no acute distress  HEENT: atraumatic, conjunttiva clear, no obvious abnormalities on inspection of external nose and ears  NECK: normal movements of the head and neck  LUNGS: on inspection no signs of respiratory distress, breathing rate appears normal, no obvious gross SOB, gasping or wheezing  CV: no obvious cyanosis  MS: moves all visible extremities without noticeable abnormality  PSYCH/NEURO: pleasant and cooperative, no obvious depression or anxiety, speech and thought processing  grossly intact Lab Results  Component Value Date   WBC 8.1 04/17/2018   HGB 15.1 04/17/2018   HCT 45.0 04/17/2018   PLT 337.0 04/17/2018   GLUCOSE 96 04/17/2018   CHOL 144 04/17/2018   TRIG 71.0 04/17/2018   HDL 61.20 04/17/2018   LDLDIRECT 152.3 04/07/2007   LDLCALC 68 04/17/2018   ALT 23 04/17/2018   AST 22 04/17/2018   NA 141 04/17/2018   K 4.2 04/17/2018   CL 105 04/17/2018   CREATININE 0.85 04/17/2018   BUN 16 04/17/2018   CO2 29 04/17/2018   TSH 1.30 07/19/2015   PSA .29ng/mL 04/25/2018   INR 0.96 05/04/2016   HGBA1C 6.0 04/17/2018    ASSESSMENT AND PLAN:  Discussed the following assessment and plan:    ICD-10-CM   1. Essential hypertension  99991111 Basic metabolic panel    CBC with Differential    Hepatic function panel    Lipid panel    Hemoglobin A1c  2. Hyperlipidemia, unspecified hyperlipidemia type  99991111 Basic metabolic panel    CBC with Differential    Hepatic function panel    Lipid panel    Hemoglobin A1c  3. Medication management  123456 Basic metabolic panel    CBC with Differential    Hepatic function panel    Lipid panel    Hemoglobin A1c  4. TOBACCO USE  [id number] Basic metabolic panel    CBC with Differential    Hepatic function panel    Lipid panel    Hemoglobin A1c   Plan monitoring labs   Make appt  For lab  Remain on same meds  Counseled.  know tobacco  Health risk  nad is average colon risk  comein for in person cpx when possible  Check w insurance and contact so we can order cologuard  Expectant management and discussion of plan and treatment with opportunity to ask questions and all were answered. The patient agreed with the plan and demonstrated an understanding of the instructions.   Advised to call back or seek an in-person evaluation if worsening  or having  further concerns . Return for cpx in the spring sometme  and   lab appt now .    Shanon Ace, MD

## 2019-05-13 ENCOUNTER — Encounter: Payer: Self-pay | Admitting: Internal Medicine

## 2019-05-13 ENCOUNTER — Telehealth (INDEPENDENT_AMBULATORY_CARE_PROVIDER_SITE_OTHER): Payer: 59 | Admitting: Internal Medicine

## 2019-05-13 ENCOUNTER — Other Ambulatory Visit: Payer: Self-pay

## 2019-05-13 DIAGNOSIS — I10 Essential (primary) hypertension: Secondary | ICD-10-CM | POA: Diagnosis not present

## 2019-05-13 DIAGNOSIS — E785 Hyperlipidemia, unspecified: Secondary | ICD-10-CM

## 2019-05-13 DIAGNOSIS — Z79899 Other long term (current) drug therapy: Secondary | ICD-10-CM

## 2019-05-13 DIAGNOSIS — F17200 Nicotine dependence, unspecified, uncomplicated: Secondary | ICD-10-CM

## 2019-06-04 ENCOUNTER — Other Ambulatory Visit: Payer: Self-pay | Admitting: Internal Medicine

## 2019-08-05 ENCOUNTER — Other Ambulatory Visit: Payer: Self-pay

## 2019-08-06 ENCOUNTER — Other Ambulatory Visit (INDEPENDENT_AMBULATORY_CARE_PROVIDER_SITE_OTHER): Payer: 59

## 2019-08-06 DIAGNOSIS — E785 Hyperlipidemia, unspecified: Secondary | ICD-10-CM

## 2019-08-06 DIAGNOSIS — I10 Essential (primary) hypertension: Secondary | ICD-10-CM

## 2019-08-06 DIAGNOSIS — F17200 Nicotine dependence, unspecified, uncomplicated: Secondary | ICD-10-CM

## 2019-08-06 DIAGNOSIS — Z79899 Other long term (current) drug therapy: Secondary | ICD-10-CM

## 2019-08-06 LAB — HEPATIC FUNCTION PANEL
ALT: 26 U/L (ref 0–53)
AST: 22 U/L (ref 0–37)
Albumin: 3.9 g/dL (ref 3.5–5.2)
Alkaline Phosphatase: 57 U/L (ref 39–117)
Bilirubin, Direct: 0.1 mg/dL (ref 0.0–0.3)
Total Bilirubin: 0.6 mg/dL (ref 0.2–1.2)
Total Protein: 6.3 g/dL (ref 6.0–8.3)

## 2019-08-06 LAB — CBC WITH DIFFERENTIAL/PLATELET
Basophils Absolute: 0.1 10*3/uL (ref 0.0–0.1)
Basophils Relative: 1 % (ref 0.0–3.0)
Eosinophils Absolute: 0.3 10*3/uL (ref 0.0–0.7)
Eosinophils Relative: 4.2 % (ref 0.0–5.0)
HCT: 44 % (ref 39.0–52.0)
Hemoglobin: 14.4 g/dL (ref 13.0–17.0)
Lymphocytes Relative: 31.9 % (ref 12.0–46.0)
Lymphs Abs: 2.4 10*3/uL (ref 0.7–4.0)
MCHC: 32.8 g/dL (ref 30.0–36.0)
MCV: 95.7 fl (ref 78.0–100.0)
Monocytes Absolute: 0.7 10*3/uL (ref 0.1–1.0)
Monocytes Relative: 9.6 % (ref 3.0–12.0)
Neutro Abs: 4 10*3/uL (ref 1.4–7.7)
Neutrophils Relative %: 53.3 % (ref 43.0–77.0)
Platelets: 334 10*3/uL (ref 150.0–400.0)
RBC: 4.6 Mil/uL (ref 4.22–5.81)
RDW: 14.5 % (ref 11.5–15.5)
WBC: 7.6 10*3/uL (ref 4.0–10.5)

## 2019-08-06 LAB — LIPID PANEL
Cholesterol: 138 mg/dL (ref 0–200)
HDL: 55 mg/dL (ref >39.00)
LDL Cholesterol: 66 mg/dL (ref 0–99)
NonHDL: 82.64
Total CHOL/HDL Ratio: 3
Triglycerides: 84 mg/dL (ref 0.0–149.0)
VLDL: 16.8 mg/dL (ref 0.0–40.0)

## 2019-08-06 LAB — BASIC METABOLIC PANEL
BUN: 16 mg/dL (ref 6–23)
CO2: 30 mEq/L (ref 19–32)
Calcium: 10.1 mg/dL (ref 8.4–10.5)
Chloride: 104 mEq/L (ref 96–112)
Creatinine, Ser: 0.95 mg/dL (ref 0.40–1.50)
GFR: 77.53 mL/min (ref >60.00)
Glucose, Bld: 109 mg/dL — ABNORMAL HIGH (ref 70–99)
Potassium: 4.2 mEq/L (ref 3.5–5.1)
Sodium: 142 mEq/L (ref 135–145)

## 2019-08-06 LAB — HEMOGLOBIN A1C: Hgb A1c MFr Bld: 5.9 % (ref 4.6–6.5)

## 2019-08-08 NOTE — Progress Notes (Signed)
Results are normal x slight elevation of blood sugar but no diabetes

## 2019-10-07 ENCOUNTER — Other Ambulatory Visit: Payer: Self-pay | Admitting: Internal Medicine

## 2019-10-10 ENCOUNTER — Other Ambulatory Visit: Payer: Self-pay | Admitting: Internal Medicine

## 2020-02-07 ENCOUNTER — Other Ambulatory Visit: Payer: Self-pay | Admitting: Internal Medicine

## 2020-02-12 ENCOUNTER — Other Ambulatory Visit: Payer: Self-pay | Admitting: Internal Medicine

## 2020-03-13 ENCOUNTER — Other Ambulatory Visit: Payer: Self-pay | Admitting: Internal Medicine

## 2020-04-19 ENCOUNTER — Other Ambulatory Visit: Payer: Self-pay | Admitting: Internal Medicine

## 2020-05-19 ENCOUNTER — Other Ambulatory Visit: Payer: Self-pay | Admitting: Internal Medicine

## 2020-06-12 ENCOUNTER — Other Ambulatory Visit: Payer: Self-pay | Admitting: Internal Medicine

## 2020-06-20 ENCOUNTER — Other Ambulatory Visit: Payer: Self-pay | Admitting: Internal Medicine

## 2020-07-23 ENCOUNTER — Telehealth: Payer: Self-pay | Admitting: Internal Medicine

## 2020-07-26 MED ORDER — SILDENAFIL CITRATE 50 MG PO TABS: 50.0000 mg | ORAL_TABLET | ORAL | 0 refills | Status: DC | PRN

## 2020-07-26 NOTE — Telephone Encounter (Signed)
sildenafil (VIAGRA) 50 MG tablet  [id number], [place] - [address] Phone:  [phone]  Fax:  [phone]

## 2020-07-26 NOTE — Telephone Encounter (Signed)
Rx sent in since pt made appt.

## 2020-07-26 NOTE — Addendum Note (Signed)
Addended by: Rodrigo Ran on: 07/26/2020 12:54 PM   Modules accepted: Orders

## 2020-08-23 ENCOUNTER — Other Ambulatory Visit (INDEPENDENT_AMBULATORY_CARE_PROVIDER_SITE_OTHER): Payer: Medicare Other

## 2020-08-23 ENCOUNTER — Other Ambulatory Visit: Payer: Self-pay

## 2020-08-23 ENCOUNTER — Other Ambulatory Visit: Payer: Self-pay | Admitting: Internal Medicine

## 2020-08-23 DIAGNOSIS — I10 Essential (primary) hypertension: Secondary | ICD-10-CM

## 2020-08-23 DIAGNOSIS — E785 Hyperlipidemia, unspecified: Secondary | ICD-10-CM

## 2020-08-23 DIAGNOSIS — Z79899 Other long term (current) drug therapy: Secondary | ICD-10-CM

## 2020-08-23 DIAGNOSIS — Z0000 Encounter for general adult medical examination without abnormal findings: Secondary | ICD-10-CM

## 2020-08-23 LAB — CBC WITH DIFFERENTIAL/PLATELET
Basophils Absolute: 0 10*3/uL (ref 0.0–0.1)
Basophils Relative: 0.7 % (ref 0.0–3.0)
Eosinophils Absolute: 0.3 10*3/uL (ref 0.0–0.7)
Eosinophils Relative: 4 % (ref 0.0–5.0)
HCT: 42.3 % (ref 39.0–52.0)
Hemoglobin: 14.2 g/dL (ref 13.0–17.0)
Lymphocytes Relative: 24 % (ref 12.0–46.0)
Lymphs Abs: 1.7 10*3/uL (ref 0.7–4.0)
MCHC: 33.6 g/dL (ref 30.0–36.0)
MCV: 93.9 fl (ref 78.0–100.0)
Monocytes Absolute: 0.7 10*3/uL (ref 0.1–1.0)
Monocytes Relative: 10.4 % (ref 3.0–12.0)
Neutro Abs: 4.3 10*3/uL (ref 1.4–7.7)
Neutrophils Relative %: 60.9 % (ref 43.0–77.0)
Platelets: 367 10*3/uL (ref 150.0–400.0)
RBC: 4.5 Mil/uL (ref 4.22–5.81)
RDW: 14.6 % (ref 11.5–15.5)
WBC: 7.1 10*3/uL (ref 4.0–10.5)

## 2020-08-23 LAB — BASIC METABOLIC PANEL
BUN: 14 mg/dL (ref 6–23)
CO2: 28 mEq/L (ref 19–32)
Calcium: 9.7 mg/dL (ref 8.4–10.5)
Chloride: 103 mEq/L (ref 96–112)
Creatinine, Ser: 0.92 mg/dL (ref 0.40–1.50)
GFR: 81.73 mL/min (ref >60.00)
Glucose, Bld: 99 mg/dL (ref 70–99)
Potassium: 4.3 mEq/L (ref 3.5–5.1)
Sodium: 142 mEq/L (ref 135–145)

## 2020-08-23 LAB — PSA: PSA: 0.22 ng/mL (ref 0.10–4.00)

## 2020-08-23 LAB — LIPID PANEL
Cholesterol: 159 mg/dL (ref 0–200)
HDL: 63.9 mg/dL (ref >39.00)
LDL Cholesterol: 74 mg/dL (ref 0–99)
NonHDL: 94.95
Total CHOL/HDL Ratio: 2
Triglycerides: 103 mg/dL (ref 0.0–149.0)
VLDL: 20.6 mg/dL (ref 0.0–40.0)

## 2020-08-23 LAB — HEPATIC FUNCTION PANEL
ALT: 22 U/L (ref 0–53)
AST: 20 U/L (ref 0–37)
Albumin: 4.2 g/dL (ref 3.5–5.2)
Alkaline Phosphatase: 58 U/L (ref 39–117)
Bilirubin, Direct: 0.1 mg/dL (ref 0.0–0.3)
Total Bilirubin: 0.7 mg/dL (ref 0.2–1.2)
Total Protein: 6.5 g/dL (ref 6.0–8.3)

## 2020-08-23 LAB — TSH: TSH: 1.81 u[IU]/mL (ref 0.35–4.50)

## 2020-08-23 MED ORDER — SILDENAFIL CITRATE 50 MG PO TABS: 50.0000 mg | ORAL_TABLET | ORAL | 0 refills | Status: DC | PRN

## 2020-08-26 NOTE — Progress Notes (Signed)
Results stable  will review at upcoming CPX

## 2020-08-30 NOTE — Progress Notes (Signed)
Chief Complaint  Patient presents with  . Annual Exam    HPI: Brian Rodriguez 75 y.o. comes in today for Preventive Medicare exam/ wellness visit .Since last visit. Medical status is stable.  Blood pressure seems to be in range has not checked it recently HLD taking medication no side effects reported Sees urologist on a regular basis Still using tobacco Working full-time still no reported heart symptoms dyspnea on exertion change in exercise tolerance or claudication. He did notice these always gets his ears clogged no matter whether they were washed out or not but when he was put on the antibiotic for the prostate is ears cleared wonders if that is a sinus problem He can get stuffy nose but no fever specific drainage.  Health Maintenance  Topic Date Due  . Hepatitis C Screening  Never done  . COLONOSCOPY (Pts 45-60yrs Insurance coverage will need to be confirmed)  06/06/2013  . TETANUS/TDAP  04/26/2020  . INFLUENZA VACCINE  10/17/2020 (Originally 01/03/2020)  . COVID-19 Vaccine (4 - Booster for Pfizer series) 12/03/2020  . PNA vac Low Risk Adult  Completed  . HPV VACCINES  Aged Out   Health Maintenance Review LIFESTYLE:  Exercise:   Work is active  Some camping  Tobacco/ETS:Y  1 ppd .  Alcohol:  Wine per night Sugar beverages: nox etoh Sleep: 6-7   Drug use: no HH:  2   1 pet       [address]   Per week      ROS:   See HPI GEN/ HEENT: No fever, significant weight changes sweats headaches vision problems  CV/ PULM; No chest pain shortness of breath cough, syncope,edema  change in exercise tolerance. GI /GU: No adominal pain, vomiting, change in bowel habits. No blood in the stool. No significant GU symptoms. SKIN/HEME: ,no acute skin rashes suspicious lesions or bleeding. No lymphadenopathy, nodules, masses.  NEURO/ PSYCH:  No neurologic signs such as weakness numbness. No depression anxiety. IMM/ Allergy: No unusual infections.  Allergy .   REST of 12 system review  negative except as per HPI   Past Medical History:  Diagnosis Date  . ED (erectile dysfunction)   . H/O echocardiogram 2/10   ef 25 -60 mildly calcific aortic valve  . Headache(784.0)   . Hyperlipidemia   . Hypertension   . Osteopenia   . Prostate cancer ([place])   . Syncope   . Wears glasses     Family History  Problem Relation Age of Onset  . Cancer Father        Prostate  . Cancer Brother        Prostate  . Parkinsonism Mother        deceased 48s   . Cancer Sister        abdominal? gi cancer?     Social History   Socioeconomic History  . Marital status: Married    Spouse name: April Agnes  . Number of children: 4  . Years of education: Not on file  . Highest education level: Not on file  Occupational History  . Not on file  Tobacco Use  . Smoking status: Current Every Day Smoker    Packs/day: 1.00    Years: 35.00    Pack years: 35.00    Types: Cigarettes  . Smokeless tobacco: Never Used  Vaping Use  . Vaping Use: Never used  Substance and Sexual Activity  . Alcohol use: Yes    Alcohol/week: 4.0 standard drinks  Types: 4 Glasses of wine per week  . Drug use: No  . Sexual activity: Yes    Comment: uses sildenafil 50 mg prn with satisfaction  Other Topics Concern  . Not on file  Social History Narrative   HH of 3  Now for 10 years    Married  Divorced x 2    Works [place]  26- 24 per week 12 hour shifts   1ppd tobacco    1 dog   Social Determinants of Radio broadcast assistant Strain: Not on file  Food Insecurity: Not on file  Transportation Needs: Not on file  Physical Activity: Not on file  Stress: Not on file  Social Connections: Not on file    Outpatient Encounter Medications as of 08/31/2020  Medication Sig  . aspirin 325 MG tablet Take 325 mg by mouth daily as needed for mild pain or headache.   . losartan (COZAAR) 25 MG tablet TAKE 1 TABLET BY MOUTH EVERY DAY  . lovastatin (MEVACOR) 40 MG tablet TAKE 1 TABLET BY MOUTH EVERYDAY AT  BEDTIME  . Multiple Vitamin (MULTIVITAMIN) tablet Take 1 tablet by mouth daily.  . sildenafil (VIAGRA) 50 MG tablet Take 1 tablet (50 mg total) by mouth as needed for erectile dysfunction.  . tamsulosin (FLOMAX) 0.4 MG CAPS capsule Take 1 capsule (0.4 mg total) by mouth daily. As needed for urinary frequency / urgency  . [DISCONTINUED] senna-docusate (SENOKOT-S) 8.6-50 MG tablet Take 1 tablet by mouth daily. While taking strong pain meds to prevent constipation.   No facility-administered encounter medications on file as of 08/31/2020.    EXAM:  BP 130/68 (BP Location: Left Arm, Patient Position: Sitting, Cuff Size: Normal)   Pulse 68   Temp 98.1 F (36.7 C) (Oral)   Ht 5\' 4"  (1.626 m)   Wt 143 lb 3.2 oz (65 kg)   SpO2 98%   BMI 24.58 kg/m   Body mass index is 24.58 kg/m.  Physical Exam: Vital signs reviewed EXH:BZJI is a well-developed well-nourished alert cooperative   who appears stated age in no acute distress.  HEENT: normocephalic atraumatic , Eyes: PERRL EOM's full, conjunctiva clear,., Ears: no deformity EAC's 2+ wa right  Left clear  TMs with normal landmarks. Mouth: masked NECK: supple without masses, thyromegaly or bruits. CHEST/PULM:  Clear to auscultation and percussion breath sounds equal no wheeze , rales or rhonchi. No chest wall deformities or tenderness.  Mild kyphosis. CV: PMI is nondisplaced, S1 S2 no gallops, murmurs, rubs. Peripheral pulses are present  without delay.No JVD .  ABDOMEN: Bowel sounds normal nontender  No guard or rebound, no hepato splenomegal no CVA tenderness.   Extremtities:  No clubbing cyanosis or edema, no acute joint swelling or redness no focal atrophy NEURO:  Oriented x3, cranial nerves 3-12 appear to be intact, no obvious focal weakness,gait within normal limits no abnormal reflexes or asymmetrical SKIN: No acute rashes normal turgor, color, no bruising or petechiae. PSYCH: Oriented, good eye contact, no obvious depression anxiety,  cognition and judgment appear normal. LN: no cervical axillary inguinal adenopathy No noted deficits in memory, attention, and speech.   Lab Results  Component Value Date   WBC 7.1 08/23/2020   HGB 14.2 08/23/2020   HCT 42.3 08/23/2020   PLT 367.0 08/23/2020   GLUCOSE 99 08/23/2020   CHOL 159 08/23/2020   TRIG 103.0 08/23/2020   HDL 63.90 08/23/2020   LDLDIRECT 152.3 04/07/2007   LDLCALC 74 08/23/2020   ALT 22 08/23/2020  AST 20 08/23/2020   NA 142 08/23/2020   K 4.3 08/23/2020   CL 103 08/23/2020   CREATININE 0.92 08/23/2020   BUN 14 08/23/2020   CO2 28 08/23/2020   TSH 1.81 08/23/2020   PSA 0.22 08/23/2020   INR 0.96 05/04/2016   HGBA1C 5.9 08/06/2019  Reviewed lab work at goal.  ASSESSMENT AND PLAN:  Discussed the following assessment and plan:  Visit for preventive health examination  Medication management  Essential hypertension  Hyperlipidemia, unspecified hyperlipidemia type  TOBACCO USE  Family hx of prostate cancer  Sensation of plugged ear, unspecified laterality  Stuffy nose Uncertain if ear symptoms were sinus eustachian tube difficulties.  Suggest adding Flonase 2 sprays each side of the nose for at least 2 weeks to see if any effect on his hearing.  He does have some wax on the right side but reports that antibiotics are what cleared his problem last time.  We could have some underlying chronic rhinitis. Discussed he can consider lung cancer screening if eligible still advised him to stop tobacco. Continue medication R OV 1 year or as indicated. Patient Care Team: Roizy Harold, Standley Brooking, MD as PCP - Alison Stalling, MD as Consulting Physician (Urology)  Patient Instructions   Glad you are doing  Well.  Still stop tobacci   Consider  Lung cancer screening .   Same meds    For a year   Try daily Flonase or Nasacort    ( otc)  2 sprays each nostril every day  ) to see if helps stuffiness and ear clogged feeling .. should know if helps  after about 2 weeks.  And then can continue.     Lung Cancer Screening A lung cancer screening is a test that checks for lung cancer. Lung cancer screening is done to look for lung cancer in its very early stages when you are not likely to have any symptoms and before it spreads beyond the lung, making it harder to treat. Finding cancer early improves the chances of successful treatment. It may save your life. Who should have screening? You should be screened for lung cancer if all of these apply:  You currently smoke or you have quit smoking within the past 15 years.  You are 87-86 years old. Screening may be recommended up to age 76 depending on your overall health and other factors.  You are in good general health.  You have a smoking history of 1 pack of cigarettes a day for 20 years or 2 packs a day for 10 years. Screening may also be recommended if you are at high risk for the disease. You may be at high risk if:  You have a family history of lung cancer.  You have been exposed to asbestos or radon.  You have chronic obstructive pulmonary disease (COPD). How is screening done? The recommended screening test is a low-dose computed tomography (LDCT) scan. This scan takes detailed images of the lungs. This allows a health care provider to look for abnormal cells. If you are at risk for lung cancer, it is recommended that you get screened once a year. Talk to your health care provider about the risks, benefits, and limitations of screening.   What are the benefits of screening? Screening can find lung cancer early, before symptoms start and before it has spread outside of the lungs. The chances of curing lung cancer are greater if the cancer is diagnosed early. What are the risks of screening?  The screening may show lung cancer when no cancer is present (false-positive result).  The screening may not find lung cancer when it is present.  The person gets exposed to radiation. How  can I lower my risk of lung cancer? Make these lifestyle changes to lower your risk of developing lung cancer:  Do not use any products that contain nicotine or tobacco, such as cigarettes, e-cigarettes, and chewing tobacco. If you need help quitting, ask your health care provider.  Avoid secondhand smoke.  Avoid exposure to radiation.  Avoid exposure to radon gas. Have your home checked for radon regularly.  Avoid things that cause cancer (carcinogens).  Avoid living or working in places with high air pollution. Questions to ask your health care provider  Am I eligible for lung cancer screening?  Does my health insurance cover the cost of lung cancer screening?  What happens if the lung cancer screening shows something of concern?  How soon will I have results from my lung cancer screening?  Is there anything that I need to do to prepare for my lung cancer screening?  What happens if I decide not to have lung cancer screening? Where to find more information Ask your health care provider about the risks and benefits of screening. More information and resources are available from these organizations:  [place] (ACS): www.cancer.org  American Lung Association: www.lung.org Contact a health care provider if:  You start to show symptoms of lung cancer, including: ? Coughing that will not go away. ? Making whistling sounds when you breathe (wheezing). ? Chest pain. ? Coughing up blood. ? Shortness of breath. ? Weight loss that cannot be explained. ? Constant tiredness (fatigue). ? Hoarse voice. Summary  Lung cancer screening may find lung cancer before symptoms appear. Finding cancer early improves the chances of successful treatment. It may save your life.  The recommended screening test is a low-dose computed tomography (LDCT) scan that looks for abnormal cells in the lungs. If you are at risk for lung cancer, it is recommended that you get screened once a  year.  You can make lifestyle changes to lower your risk of lung cancer.  Ask your health care provider about the risks and benefits of screening. This information is not intended to replace advice given to you by your health care provider. Make sure you discuss any questions you have with your health care provider. Document Revised: 10/12/2019 Document Reviewed: 05/19/2019 Elsevier Patient Education  2021 [place]. Lucero Auzenne M.D.

## 2020-08-31 ENCOUNTER — Other Ambulatory Visit: Payer: Self-pay

## 2020-08-31 ENCOUNTER — Encounter: Payer: Self-pay | Admitting: Internal Medicine

## 2020-08-31 ENCOUNTER — Ambulatory Visit (INDEPENDENT_AMBULATORY_CARE_PROVIDER_SITE_OTHER): Payer: Medicare Other | Admitting: Internal Medicine

## 2020-08-31 VITALS — BP 130/68 | HR 68 | Temp 98.1°F | Ht 64.0 in | Wt 143.2 lb

## 2020-08-31 DIAGNOSIS — Z79899 Other long term (current) drug therapy: Secondary | ICD-10-CM | POA: Diagnosis not present

## 2020-08-31 DIAGNOSIS — I10 Essential (primary) hypertension: Secondary | ICD-10-CM

## 2020-08-31 DIAGNOSIS — Z8042 Family history of malignant neoplasm of prostate: Secondary | ICD-10-CM

## 2020-08-31 DIAGNOSIS — C61 Malignant neoplasm of prostate: Secondary | ICD-10-CM

## 2020-08-31 DIAGNOSIS — E785 Hyperlipidemia, unspecified: Secondary | ICD-10-CM

## 2020-08-31 DIAGNOSIS — H938X9 Other specified disorders of ear, unspecified ear: Secondary | ICD-10-CM | POA: Diagnosis not present

## 2020-08-31 DIAGNOSIS — Z0000 Encounter for general adult medical examination without abnormal findings: Secondary | ICD-10-CM | POA: Diagnosis not present

## 2020-08-31 DIAGNOSIS — R0981 Nasal congestion: Secondary | ICD-10-CM

## 2020-08-31 DIAGNOSIS — F17200 Nicotine dependence, unspecified, uncomplicated: Secondary | ICD-10-CM

## 2020-08-31 NOTE — Patient Instructions (Signed)
Glad you are doing  Well.  Still stop tobacci   Consider  Lung cancer screening .   Same meds    For a year   Try daily Flonase or Nasacort    ( otc)  2 sprays each nostril every day  ) to see if helps stuffiness and ear clogged feeling .. should know if helps after about 2 weeks.  And then can continue.     Lung Cancer Screening A lung cancer screening is a test that checks for lung cancer. Lung cancer screening is done to look for lung cancer in its very early stages when you are not likely to have any symptoms and before it spreads beyond the lung, making it harder to treat. Finding cancer early improves the chances of successful treatment. It may save your life. Who should have screening? You should be screened for lung cancer if all of these apply:  You currently smoke or you have quit smoking within the past 15 years.  You are 63-80 years old. Screening may be recommended up to age 63 depending on your overall health and other factors.  You are in good general health.  You have a smoking history of 1 pack of cigarettes a day for 20 years or 2 packs a day for 10 years. Screening may also be recommended if you are at high risk for the disease. You may be at high risk if:  You have a family history of lung cancer.  You have been exposed to asbestos or radon.  You have chronic obstructive pulmonary disease (COPD). How is screening done? The recommended screening test is a low-dose computed tomography (LDCT) scan. This scan takes detailed images of the lungs. This allows a health care provider to look for abnormal cells. If you are at risk for lung cancer, it is recommended that you get screened once a year. Talk to your health care provider about the risks, benefits, and limitations of screening.   What are the benefits of screening? Screening can find lung cancer early, before symptoms start and before it has spread outside of the lungs. The chances of curing lung cancer are  greater if the cancer is diagnosed early. What are the risks of screening?  The screening may show lung cancer when no cancer is present (false-positive result).  The screening may not find lung cancer when it is present.  The person gets exposed to radiation. How can I lower my risk of lung cancer? Make these lifestyle changes to lower your risk of developing lung cancer:  Do not use any products that contain nicotine or tobacco, such as cigarettes, e-cigarettes, and chewing tobacco. If you need help quitting, ask your health care provider.  Avoid secondhand smoke.  Avoid exposure to radiation.  Avoid exposure to radon gas. Have your home checked for radon regularly.  Avoid things that cause cancer (carcinogens).  Avoid living or working in places with high air pollution. Questions to ask your health care provider  Am I eligible for lung cancer screening?  Does my health insurance cover the cost of lung cancer screening?  What happens if the lung cancer screening shows something of concern?  How soon will I have results from my lung cancer screening?  Is there anything that I need to do to prepare for my lung cancer screening?  What happens if I decide not to have lung cancer screening? Where to find more information Ask your health care provider about the risks and benefits  of screening. More information and resources are available from these organizations:  [place] (ACS): www.cancer.org  American Lung Association: www.lung.org Contact a health care provider if:  You start to show symptoms of lung cancer, including: ? Coughing that will not go away. ? Making whistling sounds when you breathe (wheezing). ? Chest pain. ? Coughing up blood. ? Shortness of breath. ? Weight loss that cannot be explained. ? Constant tiredness (fatigue). ? Hoarse voice. Summary  Lung cancer screening may find lung cancer before symptoms appear. Finding cancer early  improves the chances of successful treatment. It may save your life.  The recommended screening test is a low-dose computed tomography (LDCT) scan that looks for abnormal cells in the lungs. If you are at risk for lung cancer, it is recommended that you get screened once a year.  You can make lifestyle changes to lower your risk of lung cancer.  Ask your health care provider about the risks and benefits of screening. This information is not intended to replace advice given to you by your health care provider. Make sure you discuss any questions you have with your health care provider. Document Revised: 10/12/2019 Document Reviewed: 05/19/2019 Elsevier Patient Education  2021 [employer].

## 2020-09-18 ENCOUNTER — Other Ambulatory Visit: Payer: Self-pay | Admitting: Internal Medicine

## 2020-09-20 NOTE — Telephone Encounter (Signed)
Last OV: 08/31/2020 Last refill: 08/23/2020  Is this ok to refill?

## 2020-10-05 ENCOUNTER — Other Ambulatory Visit: Payer: Self-pay | Admitting: Internal Medicine

## 2020-12-17 DIAGNOSIS — T148XXA Other injury of unspecified body region, initial encounter: Secondary | ICD-10-CM | POA: Diagnosis not present

## 2020-12-31 DIAGNOSIS — T148XXA Other injury of unspecified body region, initial encounter: Secondary | ICD-10-CM | POA: Diagnosis not present

## 2020-12-31 DIAGNOSIS — L02414 Cutaneous abscess of left upper limb: Secondary | ICD-10-CM | POA: Diagnosis not present

## 2021-01-31 ENCOUNTER — Other Ambulatory Visit: Payer: Self-pay | Admitting: Internal Medicine

## 2021-03-16 ENCOUNTER — Other Ambulatory Visit: Payer: Self-pay | Admitting: Internal Medicine

## 2021-04-15 ENCOUNTER — Other Ambulatory Visit: Payer: Self-pay | Admitting: Family Medicine

## 2021-04-17 NOTE — Telephone Encounter (Signed)
Panosh pt

## 2021-05-11 DIAGNOSIS — H2513 Age-related nuclear cataract, bilateral: Secondary | ICD-10-CM | POA: Diagnosis not present

## 2021-06-06 DIAGNOSIS — R338 Other retention of urine: Secondary | ICD-10-CM | POA: Diagnosis not present

## 2021-07-20 ENCOUNTER — Telehealth: Payer: Self-pay | Admitting: Internal Medicine

## 2021-07-20 NOTE — Telephone Encounter (Signed)
Left message for patient to call back and schedule Medicare Annual Wellness Visit (AWV) either virtually or in office. Left  my Brian Rodriguez number [phone]   awvi 06/04/21 per [facility]  please schedule at anytime with [facility] Nurse Health Advisor 1 or 2   This should be a 45 minute visit.

## 2021-08-07 ENCOUNTER — Telehealth: Payer: Self-pay

## 2021-08-07 ENCOUNTER — Ambulatory Visit: Payer: Medicare Other

## 2021-08-07 NOTE — Telephone Encounter (Signed)
Patient arrived in office for scheduled AWV. Patient decided not to complete visit for personal reasons.  ?

## 2021-10-10 ENCOUNTER — Other Ambulatory Visit: Payer: Self-pay | Admitting: Internal Medicine

## 2021-10-27 ENCOUNTER — Other Ambulatory Visit: Payer: Self-pay | Admitting: Internal Medicine

## 2021-11-22 ENCOUNTER — Ambulatory Visit (INDEPENDENT_AMBULATORY_CARE_PROVIDER_SITE_OTHER): Payer: Medicare Other | Admitting: Internal Medicine

## 2021-11-22 ENCOUNTER — Encounter: Payer: Self-pay | Admitting: Internal Medicine

## 2021-11-22 VITALS — BP 128/82 | HR 61 | Temp 97.9°F | Ht 64.0 in | Wt 140.4 lb

## 2021-11-22 DIAGNOSIS — C61 Malignant neoplasm of prostate: Secondary | ICD-10-CM

## 2021-11-22 DIAGNOSIS — Z0000 Encounter for general adult medical examination without abnormal findings: Secondary | ICD-10-CM | POA: Diagnosis not present

## 2021-11-22 DIAGNOSIS — F17200 Nicotine dependence, unspecified, uncomplicated: Secondary | ICD-10-CM | POA: Diagnosis not present

## 2021-11-22 DIAGNOSIS — Z79899 Other long term (current) drug therapy: Secondary | ICD-10-CM

## 2021-11-22 DIAGNOSIS — E785 Hyperlipidemia, unspecified: Secondary | ICD-10-CM

## 2021-11-22 DIAGNOSIS — H9201 Otalgia, right ear: Secondary | ICD-10-CM

## 2021-11-22 DIAGNOSIS — I10 Essential (primary) hypertension: Secondary | ICD-10-CM

## 2021-11-22 DIAGNOSIS — H6120 Impacted cerumen, unspecified ear: Secondary | ICD-10-CM | POA: Diagnosis not present

## 2021-11-22 LAB — CBC WITH DIFFERENTIAL/PLATELET
Basophils Absolute: 0.1 10*3/uL (ref 0.0–0.1)
Basophils Relative: 0.7 % (ref 0.0–3.0)
Eosinophils Absolute: 0.2 10*3/uL (ref 0.0–0.7)
Eosinophils Relative: 1.8 % (ref 0.0–5.0)
HCT: 45.5 % (ref 39.0–52.0)
Hemoglobin: 15 g/dL (ref 13.0–17.0)
Lymphocytes Relative: 20.3 % (ref 12.0–46.0)
Lymphs Abs: 1.7 10*3/uL (ref 0.7–4.0)
MCHC: 33 g/dL (ref 30.0–36.0)
MCV: 95.1 fl (ref 78.0–100.0)
Monocytes Absolute: 0.7 10*3/uL (ref 0.1–1.0)
Monocytes Relative: 8.7 % (ref 3.0–12.0)
Neutro Abs: 5.7 10*3/uL (ref 1.4–7.7)
Neutrophils Relative %: 68.5 % (ref 43.0–77.0)
Platelets: 319 10*3/uL (ref 150.0–400.0)
RBC: 4.78 Mil/uL (ref 4.22–5.81)
RDW: 14.6 % (ref 11.5–15.5)
WBC: 8.3 10*3/uL (ref 4.0–10.5)

## 2021-11-22 LAB — BASIC METABOLIC PANEL
BUN: 9 mg/dL (ref 6–23)
CO2: 29 mEq/L (ref 19–32)
Calcium: 9.1 mg/dL (ref 8.4–10.5)
Chloride: 103 mEq/L (ref 96–112)
Creatinine, Ser: 0.87 mg/dL (ref 0.40–1.50)
GFR: 84.04 mL/min (ref >60.00)
Glucose, Bld: 99 mg/dL (ref 70–99)
Potassium: 3.9 mEq/L (ref 3.5–5.1)
Sodium: 139 mEq/L (ref 135–145)

## 2021-11-22 LAB — LIPID PANEL
Cholesterol: 132 mg/dL (ref 0–200)
HDL: 64.2 mg/dL (ref >39.00)
LDL Cholesterol: 52 mg/dL (ref 0–99)
NonHDL: 68.27
Total CHOL/HDL Ratio: 2
Triglycerides: 79 mg/dL (ref 0.0–149.0)
VLDL: 15.8 mg/dL (ref 0.0–40.0)

## 2021-11-22 LAB — HEPATIC FUNCTION PANEL
ALT: 21 U/L (ref 0–53)
AST: 21 U/L (ref 0–37)
Albumin: 4.1 g/dL (ref 3.5–5.2)
Alkaline Phosphatase: 56 U/L (ref 39–117)
Bilirubin, Direct: 0.2 mg/dL (ref 0.0–0.3)
Total Bilirubin: 0.8 mg/dL (ref 0.2–1.2)
Total Protein: 6.9 g/dL (ref 6.0–8.3)

## 2021-11-22 LAB — HEMOGLOBIN A1C: Hgb A1c MFr Bld: 6.1 % (ref 4.6–6.5)

## 2021-11-22 MED ORDER — DOCUSATE SODIUM 60 MG/15ML PO SYRP: ORAL_SOLUTION | ORAL | 0 refills | Status: DC

## 2021-11-22 MED ORDER — SILDENAFIL CITRATE 50 MG PO TABS: ORAL_TABLET | ORAL | 11 refills | Status: DC

## 2021-11-22 MED ORDER — AMOXICILLIN 500 MG PO CAPS: 500.0000 mg | ORAL_CAPSULE | Freq: Three times a day (TID) | ORAL | 0 refills | Status: AC

## 2021-11-22 NOTE — Patient Instructions (Addendum)
Good to see you today . Ear has plug of wax so not sure if infection causing pain. Empiric antibiotic. Softening drops every night for 7-10 days and then  Plan  fu to check ear  and flush out if needed.   Can use otc ear wax softening drops   or liquid colace if available.   Still advise stop tobacco . Lab today  Can get tetaus shot at  your pharmacy or here if injury. Due since 2021 by our records.

## 2021-11-22 NOTE — Progress Notes (Signed)
Blood results are normal and at goal . Continue meds

## 2021-11-22 NOTE — Progress Notes (Signed)
Chief Complaint  Patient presents with   Annual Exam    Fasting Right side ear pain possible sinus infection    HPI: Patient  Brian Rodriguez  76 y.o. comes in today for Preventive Health Care visit .  And problem visit ; continuing to work full-time Hypertension continuing losartan low-dose HLD taking lovastatin no concerns Would like refill for the Viagra last refilled in the fall early in the year. Sees the urologist Dr. Tammi Klippel about once a year he does the PSA levels He also prescribes the flomax  Only current new problem right ear has been bothersome sometimes hard to hear will use peroxide but did not help much and over the last day since the weekend 3 to 4 days has had occasional shooting pains and discomfort sometimes has face pressure wonders if he has a sinus infection.  There is no fever some left sinus tenderness mild congestion no dental problems or significant sore throat. Wonders if an antibiotic will be helpful as he had an antibiotic around surgery and it made his ear and sinuses feel better.    Health Maintenance  Topic Date Due   TETANUS/TDAP  05/24/2022 (Originally 04/26/2020)   COVID-19 Vaccine (4 - Booster for Pfizer series) 05/24/2022 (Originally 07/31/2020)   Hepatitis C Screening  05/24/2022 (Originally 10/06/1963)   Zoster Vaccines- Shingrix (2 of 2) 05/24/2022 (Originally 07/27/2019)   INFLUENZA VACCINE  01/02/2022   Pneumonia Vaccine 50+ Years old  Completed   HPV VACCINES  Aged Out   COLONOSCOPY (Pts 45-60yr Insurance coverage will need to be confirmed)  Discontinued   Health Maintenance Review LIFESTYLE:  Exercise:  at work active .  Tobacco/ETS: 1ppd.  Alcohol: not much  Sugar beverages: none Sleep: 7  Drug use: no HH of  2 no passed  Work: 12  hours '2 2 3  '$ FT    ROS:  REST of 12 system review negative except as per HPI see hpi no cp sob    Past Medical History:  Diagnosis Date   ED (erectile dysfunction)    H/O echocardiogram 2/10    ef 50 -60 mildly calcific aortic valve   Headache(784.0)    Hyperlipidemia    Hypertension    Osteopenia    Prostate cancer ([facility]    Syncope    Wears glasses     Past Surgical History:  Procedure Laterality Date   COLONOSCOPY     PROSTATE BIOPSY     RADIOACTIVE SEED IMPLANT N/A 05/11/2016   Procedure: RADIOACTIVE SEED IMPLANT/BRACHYTHERAPY IMPLANT;  Surgeon: TAlexis Frock MD;  [facility]  Service: Urology;  Laterality: N/A;    Family History  Problem Relation Age of Onset   Cancer Father        Prostate   Cancer Brother        Prostate   Parkinsonism Mother        deceased 924s   Cancer Sister        abdominal? gi cancer?     Social History   Socioeconomic History   Marital status: Married    Spouse name: April Burtt   Number of children: 4   Years of education: Not on file   Highest education level: Not on file  Occupational History   Not on file  Tobacco Use   Smoking status: Every Day    Packs/day: 1.00    Years: 35.00    Total pack years: 35.00    Types: Cigarettes  Smokeless tobacco: Never  Vaping Use   Vaping Use: Never used  Substance and Sexual Activity   Alcohol use: Yes    Alcohol/week: 4.0 standard drinks of alcohol    Types: 4 Glasses of wine per week   Drug use: No   Sexual activity: Yes    Comment: uses sildenafil 50 mg prn with satisfaction  Other Topics Concern   Not on file  Social History Narrative   HH of 3  Now for 10 years    Married  Divorced x 2    Works [employer]  60- 38 per week 12 hour shifts   1ppd tobacco    1 dog   Social Determinants of Radio broadcast assistant Strain: Not on Art therapist Insecurity: Not on file  Transportation Needs: Not on file  Physical Activity: Not on file  Stress: Not on file  Social Connections: Not on file    Outpatient Medications Prior to Visit  Medication Sig Dispense Refill   aspirin 325 MG tablet Take 325 mg by mouth daily as needed for mild pain or  headache.      losartan (COZAAR) 25 MG tablet Take 1 tablet (25 mg total) by mouth daily. 90 tablet 0   lovastatin (MEVACOR) 40 MG tablet TAKE 1 TABLET BY MOUTH EVERYDAY AT BEDTIME 90 tablet 0   Multiple Vitamin (MULTIVITAMIN) tablet Take 1 tablet by mouth daily.     tamsulosin (FLOMAX) 0.4 MG CAPS capsule Take 1 capsule (0.4 mg total) by mouth daily. As needed for urinary frequency / urgency 30 capsule 3   sildenafil (VIAGRA) 50 MG tablet TAKE 1 TABLET BY MOUTH AS NEEDED FOR ERECTILE DYSFUNCTION 3 tablet 5   No facility-administered medications prior to visit.     EXAM:  BP 128/82 (BP Location: Left Arm, Patient Position: Sitting, Cuff Size: Normal)   Pulse 61   Temp 97.9 F (36.6 C) (Oral)   Ht '5\' 4"'$  (1.626 m)   Wt 140 lb 6.4 oz (63.7 kg)   SpO2 98%   BMI 24.10 kg/m   Body mass index is 24.1 kg/m. Wt Readings from Last 3 Encounters:  11/22/21 140 lb 6.4 oz (63.7 kg)  08/31/20 143 lb 3.2 oz (65 kg)  04/17/18 137 lb 6.4 oz (62.3 kg)    Physical Exam: Vital signs reviewed WUJ:WJXB is a well-developed well-nourished alert cooperative    who appearsr stated age in no acute distress.  HEENT: normocephalic atraumatic , Eyes: PERRL EOM's full, conjunctiva clear, Nares: paten,t no deformity discharge or tenderness., Ears: no deformity EAC's r  wax brwon plug? No redness  ? Tenderness   TMs with normal landmarks mild nasal  congestion  . Mouth: clear OP, no lesions, edema.  Moist mucous membranes.  NECK: supple without masses, thyromegaly or bruits. CHEST/PULM:  Clear to auscultation and percussion breath sounds equal no wheeze , rales or rhonchi. No chest wall deformities or tenderness.  CV: PMI is nondisplaced, S1 S2 no gallops, murmurs, rubs. Peripheral pulses are full without delay.No JVD .  ABDOMEN: Bowel sounds normal nontender  No guard or rebound, no hepato splenomegal no CVA tenderness.   Extremtities:  No clubbing cyanosis or edema, no acute joint swelling or redness no  focal atrophy NEURO:  Oriented x3, cranial nerves 3-12 appear to be intact, no obvious focal weakness,gait within normal limits no abnormal reflexes or asymmetrical SKIN: No acute rashes normal turgor, color, no bruising or petechiae. Thickened toenails  PSYCH: Oriented, good eye contact,  no obvious depression anxiety, cognition and judgment appear normal. LN: no cervical axillary inguinal adenopathy  Lab Results  Component Value Date   WBC 8.3 11/22/2021   HGB 15.0 11/22/2021   HCT 45.5 11/22/2021   PLT 319.0 11/22/2021   GLUCOSE 99 11/22/2021   CHOL 132 11/22/2021   TRIG 79.0 11/22/2021   HDL 64.20 11/22/2021   LDLDIRECT 152.3 04/07/2007   LDLCALC 52 11/22/2021   ALT 21 11/22/2021   AST 21 11/22/2021   NA 139 11/22/2021   K 3.9 11/22/2021   CL 103 11/22/2021   CREATININE 0.87 11/22/2021   BUN 9 11/22/2021   CO2 29 11/22/2021   TSH 1.81 08/23/2020   PSA 0.22 08/23/2020   INR 0.96 05/04/2016   HGBA1C 6.1 11/22/2021    BP Readings from Last 3 Encounters:  11/22/21 128/82  08/31/20 130/68  04/17/18 102/62    Lab results reviewed with patient   ASSESSMENT AND PLAN:  Discussed the following assessment and plan:    ICD-10-CM   1. Visit for preventive health examination  Z00.00     2. Medication management  N02.725 Basic metabolic panel    CBC with Differential/Platelet    Hepatic function panel    Lipid panel    Hemoglobin A1c    Hemoglobin A1c    Lipid panel    Hepatic function panel    CBC with Differential/Platelet    Basic metabolic panel    3. Hyperlipidemia, unspecified hyperlipidemia type  D66.4 Basic metabolic panel    CBC with Differential/Platelet    Hepatic function panel    Lipid panel    Hemoglobin A1c    Hemoglobin A1c    Lipid panel    Hepatic function panel    CBC with Differential/Platelet    Basic metabolic panel    4. Essential hypertension  Q03 Basic metabolic panel    CBC with Differential/Platelet    Hepatic function panel     Lipid panel    Hemoglobin A1c    Hemoglobin A1c    Lipid panel    Hepatic function panel    CBC with Differential/Platelet    Basic metabolic panel    5. TOBACCO USE  K74.259 Basic metabolic panel    CBC with Differential/Platelet    Hepatic function panel    Lipid panel    Hemoglobin A1c    Hemoglobin A1c    Lipid panel    Hepatic function panel    CBC with Differential/Platelet    Basic metabolic panel   advise tobacco cessation  not planning this    6. Malignant neoplasm of prostate ([place])  C61     7. Right ear pain  D63.87 Basic metabolic panel    CBC with Differential/Platelet    Hepatic function panel    Lipid panel    Hemoglobin A1c    Hemoglobin A1c    Lipid panel    Hepatic function panel    CBC with Differential/Platelet    Basic metabolic panel    8. Wax in ear  F64.33 Basic metabolic panel    CBC with Differential/Platelet    Hepatic function panel    Lipid panel    Hemoglobin A1c    Hemoglobin A1c    Lipid panel    Hepatic function panel    CBC with Differential/Platelet    Basic metabolic panel    Not sure would help  fasting today  amox 500 tid  wax softening nightly over nex 7- 10 days  And then fu ear check  trial irrigation as indicated ( hx of failed irrigation  in past)  Will refill viagra  Lab monitoring  Have Dr Tresa Moore follow psa  Return for ear check 1-2 weeks .  Patient Care Team: Emile Ringgenberg, Standley Brooking, MD as PCP - Alison Stalling, MD as Consulting Physician (Urology) Patient Instructions  Good to see you today . Ear has plug of wax so not sure if infection causing pain. Empiric antibiotic. Softening drops every night for 7-10 days and then  Plan  fu to check ear  and flush out if needed.   Can use otc ear wax softening drops   or liquid colace if available.   Still advise stop tobacco . Lab today  Can get tetaus shot at  your pharmacy or here if injury. Due since 2021 by our records.   Standley Brooking. Raysa Bosak M.D.

## 2022-01-25 ENCOUNTER — Other Ambulatory Visit: Payer: Self-pay | Admitting: Internal Medicine

## 2022-01-29 ENCOUNTER — Other Ambulatory Visit: Payer: Self-pay | Admitting: Internal Medicine

## 2022-02-01 ENCOUNTER — Telehealth: Payer: Self-pay | Admitting: Internal Medicine

## 2022-02-01 NOTE — Telephone Encounter (Signed)
Spoke to patient and inform him that Dr. Regis Bill did not discontinue his medication. But for him to continue it per chart note from 11/22/2021.   Also update him, we just sent in a refill on Lovastatin and Losartan at his pharmacy. Patient verbalized understanding.

## 2022-02-01 NOTE — Telephone Encounter (Signed)
Pt called to say pharmacy is telling him MD is discontinuing these medications:   lovastatin (MEVACOR) 40 MG tablet  losartan (COZAAR) 25 MG tablet  Pt would like to know why? Pt states he has been taking them for a while and would like to continue taking them.  LOV:  11/22/21 - CPE  Please advise.  [id number] [place] [place]  [phone] Fax:  [phone]

## 2022-02-02 ENCOUNTER — Other Ambulatory Visit: Payer: Self-pay | Admitting: Internal Medicine

## 2022-02-06 ENCOUNTER — Other Ambulatory Visit: Payer: Self-pay | Admitting: Internal Medicine

## 2022-02-06 ENCOUNTER — Other Ambulatory Visit: Payer: Self-pay

## 2022-02-06 MED ORDER — LOSARTAN POTASSIUM 25 MG PO TABS: 25.0000 mg | ORAL_TABLET | Freq: Every day | ORAL | 0 refills | Status: DC

## 2022-02-06 NOTE — Telephone Encounter (Signed)
Pt called to say the pharmacy is telling him they have no prescriptions for him.  Pt stated he is out of these medications and needs them refilled as soon as possible.  Please advise.

## 2022-02-06 NOTE — Telephone Encounter (Signed)
Pt has been out for 2 days, says they have been trying to get this refilled losartan (COZAAR) 25 MG tablet

## 2022-02-06 NOTE — Telephone Encounter (Signed)
Losartan was resent to pharmacy. Wife, April, with patient present reports the pharmacy receive the lovastatin.

## 2022-02-06 NOTE — Telephone Encounter (Signed)
[facility] for losartan (COZAAR) 25 MG tablet, lovastatin (MEVACOR) 40 MG tablet  saying they do not have them and are requesting they be resent.  [id number] [place] [place]  [phone] Fax:  [phone]

## 2022-02-07 ENCOUNTER — Telehealth: Payer: Self-pay | Admitting: Internal Medicine

## 2022-02-07 ENCOUNTER — Other Ambulatory Visit: Payer: Self-pay

## 2022-02-07 MED ORDER — LOVASTATIN 40 MG PO TABS: ORAL_TABLET | ORAL | 3 refills | Status: DC

## 2022-02-07 MED ORDER — LOSARTAN POTASSIUM 25 MG PO TABS: 25.0000 mg | ORAL_TABLET | Freq: Every day | ORAL | 3 refills | Status: DC

## 2022-02-07 MED ORDER — LOVASTATIN 40 MG PO TABS: ORAL_TABLET | ORAL | 0 refills | Status: DC

## 2022-02-07 NOTE — Telephone Encounter (Signed)
Med resent 

## 2022-02-07 NOTE — Telephone Encounter (Signed)
April wife is calling and the losartan is not at pharm please resend to  [id number] [place] [place] Phone:  [phone] Fax:  [phone]

## 2022-02-07 NOTE — Telephone Encounter (Signed)
Pt's spouse called again and they are still waiting for this refill.  Pharmacy is saying they have not received anything from [place].  Pt has now been out of medication for several days and has been waiting since 01/25/22.  Pt's spouse is very upset.  Please advise.

## 2022-02-07 NOTE — Telephone Encounter (Signed)
Pt's spouse called to request a refill of the following:  lovastatin (MEVACOR) 40 MG tablet  LOV:  11/22/21 = CPE  Pharmacy told her someone here keeps rejecting it.  Also, she is asking why only 90 days worth?  She states its always been a year's worth.  Please advise.  [phone]  [id number] [place]:  [phone] Fax:  [phone]

## 2022-02-07 NOTE — Telephone Encounter (Signed)
Lovastatin was resent. Patient's wife aware.

## 2022-02-08 NOTE — Telephone Encounter (Signed)
Losartan and Lovastatin was resent at 5:19pm with [facility] [place].

## 2022-04-19 ENCOUNTER — Telehealth: Payer: Self-pay

## 2022-04-19 NOTE — Telephone Encounter (Signed)
Contact patient in regards to [email or web address] that was faxed to our office requesting a document.   Need patient to sign a release of information form before the document is filled.   Left a voicemail to call us back.

## 2022-04-23 ENCOUNTER — Telehealth: Payer: Self-pay

## 2022-04-23 NOTE — Telephone Encounter (Signed)
Received form from [place].  Patient came in and signed a release of information on Friday 04/20/2022.  Form filled and signed by Dr. Regis Bill.  Faxed to [facility] [phone]. Received a confirmation.

## 2022-06-21 DIAGNOSIS — R338 Other retention of urine: Secondary | ICD-10-CM | POA: Diagnosis not present

## 2022-11-18 ENCOUNTER — Other Ambulatory Visit: Payer: Self-pay | Admitting: Internal Medicine

## 2023-04-02 ENCOUNTER — Other Ambulatory Visit: Payer: Self-pay | Admitting: Internal Medicine

## 2023-04-23 ENCOUNTER — Other Ambulatory Visit: Payer: Self-pay | Admitting: Internal Medicine

## 2023-04-27 ENCOUNTER — Other Ambulatory Visit: Payer: Self-pay | Admitting: Internal Medicine

## 2023-06-10 NOTE — Progress Notes (Signed)
 Chief Complaint  Patient presents with   Annual Exam    HPI: Patient  Brian Rodriguez  78 y.o. comes in today for Preventive Health Care visit  and med check HT: ok on losartan  HLD lovastatin  no se  Tobacco  pack per day  Prostate ca followed urology  Check out rash on r leg near knee and ? Left  no itching or pain  used 1 week of topical antifungal but no change  wife said  Wife says hearing is down he thinks is fine  Health Maintenance  Topic Date Due   Medicare Annual Wellness (AWV)  Never done   Hepatitis C Screening  Never done   Lung Cancer Screening  Never done   COVID-19 Vaccine (4 - 2024-25 season) 06/27/2023 (Originally 02/03/2023)   DTaP/Tdap/Td (2 - Tdap) 06/10/2024 (Originally 04/26/2020)   Pneumonia Vaccine 59+ Years old  Completed   INFLUENZA VACCINE  Completed   Zoster Vaccines- Shingrix  Completed   HPV VACCINES  Aged Out   Colonoscopy  Discontinued   Health Maintenance Review LIFESTYLE:  Exercise:   summer camping . work Tobacco/ETS: Alcohol: wine Sugar beverages:  no Sleep:  y Drug use: no HH of   2  terror  [employer]  Work: yes still day shifts      ROS:  GEN/ HEENT: No fever, significant weight changes sweats headaches vision problems  CV/ PULM; No chest pain shortness of breath cough, syncope,edema  change in exercise tolerance. GI /GU: No adominal pain, vomiting, change in bowel habits. No blood in the stool. No significant GU symptoms. SKIN/HEME: ,nouspicious lesions or bleeding. No lymphadenopathy, nodules, masses.  NEURO/ PSYCH:  No neurologic signs such as weakness numbness. No depression anxiety. IMM/ Allergy: No unusual infections.  Allergy .   REST of 12 system review negative except as per HPI   Past Medical History:  Diagnosis Date   ED (erectile dysfunction)    H/O echocardiogram 2/10   ef 50 -60 mildly calcific aortic valve   Headache(784.0)    Hyperlipidemia    Hypertension    Osteopenia    Prostate cancer ([facility])     Syncope    Wears glasses     Past Surgical History:  Procedure Laterality Date   COLONOSCOPY     PROSTATE BIOPSY     RADIOACTIVE SEED IMPLANT N/A 05/11/2016   Procedure: RADIOACTIVE SEED IMPLANT/BRACHYTHERAPY IMPLANT;  Surgeon: Ricardo Likens, MD;  Location: [facility];  Service: Urology;  Laterality: N/A;    Family History  Problem Relation Age of Onset   Cancer Father        Prostate   Cancer Brother        Prostate   Parkinsonism Mother        deceased 75s    Cancer Sister        abdominal? gi cancer?     Social History   Socioeconomic History   Marital status: Married    Spouse name: April Bekker   Number of children: 4   Years of education: Not on file   Highest education level: Associate degree: occupational, scientist, product/process development, or vocational program  Occupational History   Not on file  Tobacco Use   Smoking status: Every Day    Current packs/day: 1.00    Average packs/day: 1 pack/day for 35.0 years (35.0 ttl pk-yrs)    Types: Cigarettes   Smokeless tobacco: Never  Vaping Use   Vaping status: Never Used  Substance and  Sexual Activity   Alcohol use: Yes    Alcohol/week: 4.0 standard drinks of alcohol    Types: 4 Glasses of wine per week   Drug use: No   Sexual activity: Yes    Comment: uses sildenafil  50 mg prn with satisfaction  Other Topics Concern   Not on file  Social History Narrative   HH of 3  Now for 10 years    Married  Divorced x 2    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   1ppd tobacco    1 dog   Social Drivers of Corporate Investment Banker Strain: Low Risk  (06/07/2023)   Overall Financial Resource Strain (CARDIA)    Difficulty of Paying Living Expenses: Not very hard  Food Insecurity: No Food Insecurity (06/07/2023)   Hunger Vital Sign    Worried About Running Out of Food in the Last Year: Never true    Ran Out of Food in the Last Year: Never true  Transportation Needs: No Transportation Needs (06/07/2023)   PRAPARE -  Administrator, Civil Service (Medical): No    Lack of Transportation (Non-Medical): No  Physical Activity: Inactive (06/07/2023)   Exercise Vital Sign    Days of Exercise per Week: 3 days    Minutes of Exercise per Session: 0 min  Stress: No Stress Concern Present (06/07/2023)   [employer] of Occupational Health - Occupational Stress Questionnaire    Feeling of Stress : Not at all  Social Connections: Moderately Isolated (06/07/2023)   Social Connection and Isolation Panel [NHANES]    Frequency of Communication with Friends and Family: Twice a week    Frequency of Social Gatherings with Friends and Family: Once a week    Attends Religious Services: Never    Database Administrator or Organizations: No    Attends Engineer, Structural: Not on file    Marital Status: Married    Outpatient Medications Prior to Visit  Medication Sig Dispense Refill   aspirin 325 MG tablet Take 325 mg by mouth daily as needed for mild pain or headache.      losartan  (COZAAR ) 25 MG tablet TAKE 1 TABLET (25 MG TOTAL) BY MOUTH DAILY. 90 tablet 3   lovastatin  (MEVACOR ) 40 MG tablet TAKE 1 TABLET BY MOUTH EVERYDAY AT BEDTIME 90 tablet 1   Multiple Vitamin (MULTIVITAMIN) tablet Take 1 tablet by mouth daily.     sildenafil  (VIAGRA ) 50 MG tablet TAKE 1 TABLET BY MOUTH AS NEEDED FOR ERECTILE DYSFUNCTION 3 tablet 11   tamsulosin  (FLOMAX ) 0.4 MG CAPS capsule Take 1 capsule (0.4 mg total) by mouth daily. As needed for urinary frequency / urgency 30 capsule 3   docusate (COLACE) 60 MG/15ML syrup 5-10 drops right ear at night as directed 25 mL 0   No facility-administered medications prior to visit.     EXAM:  BP 130/76 (BP Location: Left Arm, Patient Position: Sitting, Cuff Size: Normal)   Pulse 67   Temp 98 F (36.7 C) (Oral)   Ht 5' 3 (1.6 m)   Wt 133 lb 6.4 oz (60.5 kg)   SpO2 96%   BMI 23.63 kg/m   Body mass index is 23.63 kg/m. Wt Readings from Last 3 Encounters:  06/11/23 133  lb 6.4 oz (60.5 kg)  11/22/21 140 lb 6.4 oz (63.7 kg)  08/31/20 143 lb 3.2 oz (65 kg)    Physical Exam: Vital signs reviewed HZW:Uypd is a well-developed  well-nourished alert cooperative    who appearsr stated age in no acute distress.  HEENT: normocephalic atraumatic , Eyes: PERRL EOM's full, conjunctiva clear, Nares: paten,t no deformity discharge or tenderness., Ears: no deformity EAC's r 2+ wax  left TMs with normal landmarks. Mouth: clear OP, no lesions, edema.  Moist mucous membranes. NECK: supple without masses, thyromegaly or bruits. CHEST/PULM:  Clear to auscultation and percussion breath sounds equal no wheeze , rales or rhonchi. No chest wall deformities or tenderness. CV: PMI is nondisplaced, S1 S2 no gallops, murmurs, rubs. Peripheral pulses are present without delay.No JVD .  ABDOMEN: Bowel sounds normal nontender  No guard or rebound, no hepato splenomegal no CVA tenderness.   Extremtities:  No clubbing cyanosis or edema, no acute joint swelling or redness no focal atrophy NEURO:  Oriented x3, cranial nerves 3-12 appear to be intact, no obvious focal weakness,gait within normal limits no abnormal reflexes or asymmetrical SKIN:  normal turgor, color, no bruising or petechiae. Blockes over r knee and lateral oivid and patches with ring edge but indistinct  Rest  has skin marks  purplish  pink color  ? Beginning are left extensor knee area   PSYCH: Oriented, good eye contact, no obvious depression anxiety, cognition and judgment appear normal. LN: no cervical axillaryadenopathy  Lab Results  Component Value Date   WBC 8.3 11/22/2021   HGB 15.0 11/22/2021   HCT 45.5 11/22/2021   PLT 319.0 11/22/2021   GLUCOSE 99 11/22/2021   CHOL 132 11/22/2021   TRIG 79.0 11/22/2021   HDL 64.20 11/22/2021   LDLDIRECT 152.3 04/07/2007   LDLCALC 52 11/22/2021   ALT 21 11/22/2021   AST 21 11/22/2021   NA 139 11/22/2021   K 3.9 11/22/2021   CL 103 11/22/2021   CREATININE 0.87 11/22/2021    BUN 9 11/22/2021   CO2 29 11/22/2021   TSH 1.81 08/23/2020   PSA 0.22 08/23/2020   INR 0.96 05/04/2016   HGBA1C 6.1 11/22/2021    BP Readings from Last 3 Encounters:  06/11/23 130/76  11/22/21 128/82  08/31/20 130/68    Lab update plan   reviewed with patient   ASSESSMENT AND PLAN:  Discussed the following assessment and plan:    ICD-10-CM   1. Visit for preventive health examination  Z00.00     2. Rash  R21 CBC with Differential/Platelet    Comprehensive metabolic panel    Lipid panel    PSA    TSH    Hep C Antibody   consider granuloma annulare  or other consider derm for dx    3. Medication management  Z79.899 CBC with Differential/Platelet    Comprehensive metabolic panel    Lipid panel    PSA    TSH    Hep C Antibody    4. Essential hypertension  I10 CBC with Differential/Platelet    Comprehensive metabolic panel    Lipid panel    PSA    TSH    Hep C Antibody    5. TOBACCO USE  F17.200 CBC with Differential/Platelet    Comprehensive metabolic panel    Lipid panel    PSA    TSH    Hep C Antibody    6. Hyperlipidemia, unspecified hyperlipidemia type  E78.5 CBC with Differential/Platelet    Comprehensive metabolic panel    Lipid panel    PSA    TSH    Hep C Antibody    7. Malignant neoplasm of prostate ([facility])  C61  CBC with Differential/Platelet    Comprehensive metabolic panel    Lipid panel    PSA    TSH    Hep C Antibody   followed by urology    8. Need for hepatitis C screening test  Z11.59 Hep C Antibody    Uncertain rash  cause  poss granuloma annulare but multiple patches and no sx  Consider derm eval for reasons described  contact us  if need a derm referral . Info given of [facility] for hearing eval if wishes . Cont ht and hld meds  Reviewed info for lung cancer screening and on hold for now.  Advise tobacco cessation  Return in about 1 year (around 06/10/2024) for depending on results.  Patient Care Team: Sheccid Lahmann, Apolinar POUR, MD as  PCP - General Alvaro, Ricardo KATHEE Raddle., MD as Consulting Physician (Urology) Patient Instructions  The rash could  be  granuloma annulare  can be caused by many things   can look like ringworm so ok to try that treatment  but dont think would work.  Can go away on its own   but if spreading can see dermatologist  ( no one particular treatment  but derm can confirm dx)  can try cortisone topical if antifungal cream not helpful   Can consider lung cancer screening  as we discussed . But a choice  Better to stop tobacco.   No change in meds at this time.        Chanoch Mccleery K. Curry Seefeldt M.D.

## 2023-06-11 ENCOUNTER — Encounter: Payer: Self-pay | Admitting: Internal Medicine

## 2023-06-11 ENCOUNTER — Ambulatory Visit (INDEPENDENT_AMBULATORY_CARE_PROVIDER_SITE_OTHER): Payer: Medicare Other | Admitting: Internal Medicine

## 2023-06-11 VITALS — BP 130/76 | HR 67 | Temp 98.0°F | Ht 63.0 in | Wt 133.4 lb

## 2023-06-11 DIAGNOSIS — Z0001 Encounter for general adult medical examination with abnormal findings: Secondary | ICD-10-CM | POA: Diagnosis not present

## 2023-06-11 DIAGNOSIS — C61 Malignant neoplasm of prostate: Secondary | ICD-10-CM

## 2023-06-11 DIAGNOSIS — Z0000 Encounter for general adult medical examination without abnormal findings: Secondary | ICD-10-CM

## 2023-06-11 DIAGNOSIS — I10 Essential (primary) hypertension: Secondary | ICD-10-CM

## 2023-06-11 DIAGNOSIS — E785 Hyperlipidemia, unspecified: Secondary | ICD-10-CM

## 2023-06-11 DIAGNOSIS — F17200 Nicotine dependence, unspecified, uncomplicated: Secondary | ICD-10-CM

## 2023-06-11 DIAGNOSIS — Z79899 Other long term (current) drug therapy: Secondary | ICD-10-CM

## 2023-06-11 DIAGNOSIS — R21 Rash and other nonspecific skin eruption: Secondary | ICD-10-CM | POA: Diagnosis not present

## 2023-06-11 DIAGNOSIS — Z1159 Encounter for screening for other viral diseases: Secondary | ICD-10-CM | POA: Diagnosis not present

## 2023-06-11 LAB — COMPREHENSIVE METABOLIC PANEL
ALT: 26 U/L (ref 0–53)
AST: 24 U/L (ref 0–37)
Albumin: 4.4 g/dL (ref 3.5–5.2)
Alkaline Phosphatase: 67 U/L (ref 39–117)
BUN: 15 mg/dL (ref 6–23)
CO2: 29 meq/L (ref 19–32)
Calcium: 9.5 mg/dL (ref 8.4–10.5)
Chloride: 102 meq/L (ref 96–112)
Creatinine, Ser: 0.9 mg/dL (ref 0.40–1.50)
GFR: 82.28 mL/min (ref >60.00)
Glucose, Bld: 99 mg/dL (ref 70–99)
Potassium: 4 meq/L (ref 3.5–5.1)
Sodium: 140 meq/L (ref 135–145)
Total Bilirubin: 0.8 mg/dL (ref 0.2–1.2)
Total Protein: 6.8 g/dL (ref 6.0–8.3)

## 2023-06-11 LAB — CBC WITH DIFFERENTIAL/PLATELET
Basophils Absolute: 0.1 10*3/uL (ref 0.0–0.1)
Basophils Relative: 0.8 % (ref 0.0–3.0)
Eosinophils Absolute: 0.1 10*3/uL (ref 0.0–0.7)
Eosinophils Relative: 2.2 % (ref 0.0–5.0)
HCT: 45.3 % (ref 39.0–52.0)
Hemoglobin: 14.9 g/dL (ref 13.0–17.0)
Lymphocytes Relative: 26.6 % (ref 12.0–46.0)
Lymphs Abs: 1.8 10*3/uL (ref 0.7–4.0)
MCHC: 32.9 g/dL (ref 30.0–36.0)
MCV: 95.9 fL (ref 78.0–100.0)
Monocytes Absolute: 0.7 10*3/uL (ref 0.1–1.0)
Monocytes Relative: 10.8 % (ref 3.0–12.0)
Neutro Abs: 4 10*3/uL (ref 1.4–7.7)
Neutrophils Relative %: 59.6 % (ref 43.0–77.0)
Platelets: 342 10*3/uL (ref 150.0–400.0)
RBC: 4.73 Mil/uL (ref 4.22–5.81)
RDW: 14.5 % (ref 11.5–15.5)
WBC: 6.7 10*3/uL (ref 4.0–10.5)

## 2023-06-11 LAB — LIPID PANEL
Cholesterol: 158 mg/dL (ref 0–200)
HDL: 71.3 mg/dL (ref >39.00)
LDL Cholesterol: 75 mg/dL (ref 0–99)
NonHDL: 86.9
Total CHOL/HDL Ratio: 2
Triglycerides: 59 mg/dL (ref 0.0–149.0)
VLDL: 11.8 mg/dL (ref 0.0–40.0)

## 2023-06-11 LAB — TSH: TSH: 1.26 u[IU]/mL (ref 0.35–5.50)

## 2023-06-11 LAB — PSA: PSA: 0.28 ng/mL (ref 0.10–4.00)

## 2023-06-11 NOTE — Patient Instructions (Addendum)
 The rash could  be  granuloma annulare  can be caused by many things   can look like ringworm so ok to try that treatment  but dont think would work.  Can go away on its own   but if spreading can see dermatologist  ( no one particular treatment  but derm can confirm dx)  can try cortisone topical if antifungal cream not helpful   Can consider lung cancer screening  as we discussed . But a choice  Better to stop tobacco.   No change in meds at this time.

## 2023-06-12 LAB — HEPATITIS C ANTIBODY: Hepatitis C Ab: NONREACTIVE

## 2023-06-12 NOTE — Progress Notes (Signed)
 Blood results are all in range  see  you in a year or if needed before .

## 2023-06-24 DIAGNOSIS — R338 Other retention of urine: Secondary | ICD-10-CM | POA: Diagnosis not present

## 2023-09-20 ENCOUNTER — Other Ambulatory Visit: Payer: Self-pay | Admitting: Family

## 2023-10-17 ENCOUNTER — Other Ambulatory Visit: Payer: Self-pay | Admitting: Family

## 2023-10-21 NOTE — Telephone Encounter (Signed)
 Copied from CRM [id number]. Topic: Clinical - Medication Refill >> Oct 21, 2023  1:08 PM Juleen Oakland F wrote: Medication:   sildenafil  (VIAGRA ) 50 MG tablet  lovastatin  (MEVACOR ) 40 MG tablet    Has the patient contacted their pharmacy? Yes (Agent: If no, request that the patient contact the pharmacy for the refill. If patient does not wish to contact the pharmacy document the reason why and proceed with request.) (Agent: If yes, when and what did the pharmacy advise?)  This is the patient's preferred pharmacy:  [id number] Jonette Nestle, [place] - [address] [place] [address] Phone: [phone] Fax: [phone]  Is this the correct pharmacy for this prescription? Yes If no, delete pharmacy and type the correct one.   Has the prescription been filled recently? Yes  Is the patient out of the medication? No  Has the patient been seen for an appointment in the last year OR does the patient have an upcoming appointment? Yes  Can we respond through MyChart? No  Agent: Please be advised that Rx refills may take up to 3 business days. We ask that you follow-up with your pharmacy.

## 2023-11-07 ENCOUNTER — Telehealth: Payer: Self-pay | Admitting: *Deleted

## 2023-11-07 MED ORDER — SILDENAFIL CITRATE 50 MG PO TABS: ORAL_TABLET | ORAL | 11 refills | Status: AC

## 2023-11-07 NOTE — Telephone Encounter (Signed)
 Copied from CRM [id number]. Topic: Clinical - Prescription Issue >> Nov 07, 2023  9:34 AM Howard Macho wrote: Reason for CRM: patient called stating he sent a request on 5/19 for his sildenafil  (VIAGRA ) 50 MG tablet but it is not at the pharmacy CB [phone]

## 2024-03-02 ENCOUNTER — Ambulatory Visit

## 2024-03-02 VITALS — BP 120/60 | HR 60 | Temp 97.5°F | Ht 62.0 in | Wt 140.3 lb

## 2024-03-02 DIAGNOSIS — Z0000 Encounter for general adult medical examination without abnormal findings: Secondary | ICD-10-CM

## 2024-03-02 NOTE — Progress Notes (Signed)
 Subjective:   Brian Rodriguez is a 78 y.o. who presents for a Medicare Wellness preventive visit.  As a reminder, Annual Wellness Visits don't include a physical exam, and some assessments may be limited, especially if this visit is performed virtually. We may recommend an in-person follow-up visit with your provider if needed.  Visit Complete: In person    Persons Participating in Visit: Patient.  AWV Questionnaire: Yes: Patient Medicare AWV questionnaire was completed by the patient on 02/10/24; I have confirmed that all information answered by patient is correct and no changes since this date.  Cardiac Risk Factors include: advanced age (>98men, >60 women);male gender;smoking/ tobacco exposure;hypertension     Objective:    Today's Vitals   03/02/24 0814  BP: 120/60  Pulse: 60  Temp: (!) 97.5 F (36.4 C)  TempSrc: Oral  SpO2: 97%  Weight: 140 lb 4.8 oz (63.6 kg)  Height: 5' 2 (1.575 m)   Body mass index is 25.66 kg/m.     03/02/2024    8:23 AM 05/14/2016    6:43 AM 05/11/2016   11:50 AM 05/08/2016    3:15 PM 12/29/2015    7:35 AM  Advanced Directives  Does Patient Have a Medical Advance Directive? No No  No  No  No   Would patient like information on creating a medical advance directive? No - Patient declined  No - Patient declined  No - Patient declined  No - patient declined information      Data saved with a previous flowsheet row definition    Current Medications (verified) Outpatient Encounter Medications as of 03/02/2024  Medication Sig   aspirin 325 MG tablet Take 325 mg by mouth daily as needed for mild pain or headache.    losartan  (COZAAR ) 25 MG tablet TAKE 1 TABLET (25 MG TOTAL) BY MOUTH DAILY.   lovastatin  (MEVACOR ) 40 MG tablet TAKE 1 TABLET BY MOUTH EVERYDAY AT BEDTIME   Multiple Vitamin (MULTIVITAMIN) tablet Take 1 tablet by mouth daily.   sildenafil  (VIAGRA ) 50 MG tablet TAKE 1 TABLET BY MOUTH AS NEEDED FOR ERECTILE DYSFUNCTION   tamsulosin   (FLOMAX ) 0.4 MG CAPS capsule Take 1 capsule (0.4 mg total) by mouth daily. As needed for urinary frequency / urgency   No facility-administered encounter medications on file as of 03/02/2024.    Allergies (verified) Lisinopril  and Shrimp [shellfish allergy]   History: Past Medical History:  Diagnosis Date   ED (erectile dysfunction)    H/O echocardiogram 2/10   ef 50 -60 mildly calcific aortic valve   Headache(784.0)    Hyperlipidemia    Hypertension    Osteopenia    Prostate cancer (HCC)    Syncope    Wears glasses    Past Surgical History:  Procedure Laterality Date   COLONOSCOPY     PROSTATE BIOPSY     RADIOACTIVE SEED IMPLANT N/A 05/11/2016   Procedure: RADIOACTIVE SEED IMPLANT/BRACHYTHERAPY IMPLANT;  Surgeon: Ricardo Likens, MD;  Location: [facility];  Service: Urology;  Laterality: N/A;   Family History  Problem Relation Age of Onset   Cancer Father        Prostate   Cancer Brother        Prostate   Parkinsonism Mother        deceased 42s    Cancer Sister        abdominal? gi cancer?    Social History   Socioeconomic History   Marital status: Married    Spouse name: April  Mcfarland   Number of children: 4   Years of education: Not on file   Highest education level: Associate degree: occupational, Scientist, product/process development, or vocational program  Occupational History   Not on file  Tobacco Use   Smoking status: Every Day    Current packs/day: 1.00    Average packs/day: 1 pack/day for 35.0 years (35.0 ttl pk-yrs)    Types: Cigarettes   Smokeless tobacco: Never  Vaping Use   Vaping status: Never Used  Substance and Sexual Activity   Alcohol use: Yes    Alcohol/week: 4.0 standard drinks of alcohol    Types: 4 Glasses of wine per week   Drug use: No   Sexual activity: Yes    Comment: uses sildenafil  50 mg prn with satisfaction  Other Topics Concern   Not on file  Social History Narrative   HH of 3  Now for 10 years    Married  Divorced x 2    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   1ppd tobacco    1 dog   Social Drivers of Corporate investment banker Strain: Low Risk  (03/02/2024)   Overall Financial Resource Strain (CARDIA)    Difficulty of Paying Living Expenses: Not hard at all  Food Insecurity: No Food Insecurity (03/02/2024)   Hunger Vital Sign    Worried About Running Out of Food in the Last Year: Never true    Ran Out of Food in the Last Year: Never true  Transportation Needs: No Transportation Needs (03/02/2024)   PRAPARE - Administrator, Civil Service (Medical): No    Lack of Transportation (Non-Medical): No  Physical Activity: Inactive (03/02/2024)   Exercise Vital Sign    Days of Exercise per Week: 0 days    Minutes of Exercise per Session: 0 min  Stress: No Stress Concern Present (03/02/2024)   [employer] of Occupational Health - Occupational Stress Questionnaire    Feeling of Stress: Not at all  Social Connections: Moderately Isolated (03/02/2024)   Social Connection and Isolation Panel    Frequency of Communication with Friends and Family: More than three times a week    Frequency of Social Gatherings with Friends and Family: More than three times a week    Attends Religious Services: Never    Database administrator or Organizations: No    Attends Engineer, structural: Never    Marital Status: Married    Tobacco Counseling Ready to quit: No Counseling given: Yes    Clinical Intake:  Pre-visit preparation completed: Yes  Pain : No/denies pain     BMI - recorded: 25.66 Nutritional Status: BMI 25 -29 Overweight Nutritional Risks: None Diabetes: No  Lab Results  Component Value Date   HGBA1C 6.1 11/22/2021   HGBA1C 5.9 08/06/2019   HGBA1C 6.0 04/17/2018     How often do you need to have someone help you when you read instructions, pamphlets, or other written materials from your doctor or pharmacy?: 1 - Never  Interpreter Needed?: No  Information entered by  :: Rojelio Blush LPN   Activities of Daily Living     03/02/2024    8:22 AM 03/01/2024    8:36 PM  In your present state of health, do you have any difficulty performing the following activities:  Hearing? 0 0  Vision? 0 0  Difficulty concentrating or making decisions? 0 0  Walking or climbing stairs? 0 0  Dressing or bathing?  0 0  Doing errands, shopping? 0 0  Preparing Food and eating ? N N  Using the Toilet? N N  In the past six months, have you accidently leaked urine? N N  Do you have problems with loss of bowel control? N N  Managing your Medications? N N  Managing your Finances? N N  Housekeeping or managing your Housekeeping? N N    Patient Care Team: Panosh, Wanda K, MD as PCP - General Alvaro, Ricardo KATHEE Raddle., MD as Consulting Physician (Urology)  I have updated your Care Teams any recent Medical Services you may have received from other providers in the past year.     Assessment:   This is a routine wellness examination for Tevin.  Hearing/Vision screen Hearing Screening - Comments:: Denies hearing difficulties   Vision Screening - Comments:: Wears rx glasses - Not up to date with routine eye exams with  [facility]   Goals Addressed               This Visit's Progress     Continue physical activity (pt-stated)        Remain Active       Depression Screen     03/02/2024    8:20 AM 06/11/2023   11:29 AM 11/22/2021    2:01 PM 04/17/2018    8:20 AM 01/01/2017    9:40 AM 12/29/2015    7:35 AM 10/20/2015    8:22 AM  PHQ 2/9 Scores  PHQ - 2 Score 0 0 0 0 0 0 0  PHQ- 9 Score  0 0        Fall Risk     03/02/2024    8:21 AM 03/01/2024    8:36 PM 06/11/2023   11:29 AM 11/22/2021    2:01 PM 01/01/2017    9:40 AM  Fall Risk   Falls in the past year? 0 1 0 0 No   Number falls in past yr: 0 0 0 0   Injury with Fall? 0 0 0 0   Risk for fall due to : No Fall Risks  No Fall Risks No Fall Risks   Follow up Falls evaluation completed  Falls evaluation  completed Falls prevention discussed       Data saved with a previous flowsheet row definition    MEDICARE RISK AT HOME:  Medicare Risk at Home Any stairs in or around the home?: Yes If so, are there any without handrails?: No Home free of loose throw rugs in walkways, pet beds, electrical cords, etc?: Yes Adequate lighting in your home to reduce risk of falls?: Yes Life alert?: No Use of a cane, walker or w/c?: No Grab bars in the bathroom?: No Shower chair or bench in shower?: No Elevated toilet seat or a handicapped toilet?: No  TIMED UP AND GO:  Was the test performed?  Yes  Length of time to ambulate 10 feet: 10 sec Gait steady and fast without use of assistive device  Cognitive Function: 6CIT completed        03/02/2024    8:23 AM  6CIT Screen  What Year? 0 points  What month? 0 points  What time? 0 points  Count back from 20 0 points  Months in reverse 0 points  Repeat phrase 0 points  Total Score 0 points    Immunizations Immunization History  Administered Date(s) Administered   Influenza Split 04/04/2013   Influenza-Unspecified 05/08/2023   PFIZER(Purple Top)SARS-COV-2 Vaccination 08/03/2019, 09/03/2019, 06/05/2020  Pneumococcal Conjugate-13 05/12/2013   Pneumococcal Polysaccharide-23 07/14/2014   Td 04/26/2010   Zoster Recombinant(Shingrix) 04/01/2019, 06/01/2019    Screening Tests Health Maintenance  Topic Date Due   Lung Cancer Screening  Never done   Influenza Vaccine  01/03/2024   COVID-19 Vaccine (4 - 2025-26 season) 02/03/2024   DTaP/Tdap/Td (2 - Tdap) 06/10/2024 (Originally 04/26/2020)   Medicare Annual Wellness (AWV)  03/02/2025   Pneumococcal Vaccine: 50+ Years  Completed   Hepatitis C Screening  Completed   Zoster Vaccines- Shingrix  Completed   HPV VACCINES  Aged Out   Meningococcal B Vaccine  Aged Out   Colonoscopy  Discontinued    Health Maintenance Items Addressed:   Additional Screening:  Vision Screening: Recommended  annual ophthalmology exams for early detection of glaucoma and other disorders of the eye. Is the patient up to date with their annual eye exam?  No  Who is the provider or what is the name of the office in which the patient attends annual eye exams? [facility]  Dental Screening: Recommended annual dental exams for proper oral hygiene  Community Resource Referral / Chronic Care Management: CRR required this visit?  No   CCM required this visit?  No   Plan:    I have personally reviewed and noted the following in the patient's chart:   Medical and social history Use of alcohol, tobacco or illicit drugs  Current medications and supplements including opioid prescriptions. Patient is not currently taking opioid prescriptions. Functional ability and status Nutritional status Physical activity Advanced directives List of other physicians Hospitalizations, surgeries, and ER visits in previous 12 months Vitals Screenings to include cognitive, depression, and falls Referrals and appointments  In addition, I have reviewed and discussed with patient certain preventive protocols, quality metrics, and best practice recommendations. A written personalized care plan for preventive services as well as general preventive health recommendations were provided to patient.   Rojelio LELON Blush, LPN   [id number]   After Visit Summary: (In Person-Printed) AVS printed and given to the patient  Notes: Nothing significant to report at this time.

## 2024-03-02 NOTE — Patient Instructions (Addendum)
 Brian Rodriguez,  Thank you for taking the time for your Medicare Wellness Visit. I appreciate your continued commitment to your health goals. Please review the care plan we discussed, and feel free to reach out if I can assist you further.  Medicare recommends these wellness visits once per year to help you and your care team stay ahead of potential health issues. These visits are designed to focus on prevention, allowing your provider to concentrate on managing your acute and chronic conditions during your regular appointments.  Please note that Annual Wellness Visits do not include a physical exam. Some assessments may be limited, especially if the visit was conducted virtually. If needed, we may recommend a separate in-person follow-up with your provider.  Ongoing Care Seeing your primary care provider every 3 to 6 months helps us  monitor your health and provide consistent, personalized care.   Referrals If a referral was made during today's visit and you haven't received any updates within two weeks, please contact the referred provider directly to check on the status.  Recommended Screenings:  Health Maintenance  Topic Date Due   Screening for Lung Cancer  Never done   Flu Shot  01/03/2024   COVID-19 Vaccine (4 - 2025-26 season) 02/03/2024   DTaP/Tdap/Td vaccine (2 - Tdap) 06/10/2024*   Medicare Annual Wellness Visit  03/02/2025   Pneumococcal Vaccine for age over 44  Completed   Hepatitis C Screening  Completed   Zoster (Shingles) Vaccine  Completed   HPV Vaccine  Aged Out   Meningitis B Vaccine  Aged Out   Colon Cancer Screening  Discontinued  *Topic was postponed. The date shown is not the original due date.       03/02/2024    8:23 AM  Advanced Directives  Does Patient Have a Medical Advance Directive? No  Would patient like information on creating a medical advance directive? No - Patient declined   Advance Care Planning is important because it: Ensures you receive  medical care that aligns with your values, goals, and preferences. Provides guidance to your family and loved ones, reducing the emotional burden of decision-making during critical moments.  Vision: Annual vision screenings are recommended for early detection of glaucoma, cataracts, and diabetic retinopathy. These exams can also reveal signs of chronic conditions such as diabetes and high blood pressure.  Dental: Annual dental screenings help detect early signs of oral cancer, gum disease, and other conditions linked to overall health, including heart disease and diabetes.  Please see the attached documents for additional preventive care recommendations.

## 2024-04-15 ENCOUNTER — Other Ambulatory Visit: Payer: Self-pay | Admitting: Family

## 2024-04-15 ENCOUNTER — Other Ambulatory Visit: Payer: Self-pay | Admitting: Internal Medicine

## 2024-05-07 ENCOUNTER — Telehealth: Payer: Self-pay | Admitting: *Deleted

## 2024-05-07 NOTE — Telephone Encounter (Signed)
 Copied from CRM [id number]. Topic: Clinical - Prescription Issue >> May 07, 2024  1:52 PM Brian Rodriguez wrote: Reason for CRM: Patient called said he has not received his refill for losartan  (COZAAR ) 25 MG tablet [[id number]] and he will soon be out of his medication.

## 2024-05-11 MED ORDER — LOSARTAN POTASSIUM 25 MG PO TABS: 25.0000 mg | ORAL_TABLET | Freq: Every day | ORAL | 0 refills | Status: AC

## 2024-05-11 NOTE — Telephone Encounter (Signed)
 Attempted to reach pt. Left a voicemail to call us back.
